# Patient Record
Sex: Male | Born: 1995 | Race: White | Hispanic: No | Marital: Single | Smoking: Never smoker
Health system: Southern US, Community
[De-identification: ages and names within clinical notes are randomized; demographics above are authoritative.]

## PROBLEM LIST (undated history)

## (undated) DIAGNOSIS — S42309A Unspecified fracture of shaft of humerus, unspecified arm, initial encounter for closed fracture: Secondary | ICD-10-CM

## (undated) DIAGNOSIS — F329 Major depressive disorder, single episode, unspecified: Secondary | ICD-10-CM

## (undated) DIAGNOSIS — F32A Depression, unspecified: Secondary | ICD-10-CM

## (undated) DIAGNOSIS — F988 Other specified behavioral and emotional disorders with onset usually occurring in childhood and adolescence: Secondary | ICD-10-CM

## (undated) HISTORY — DX: Unspecified fracture of shaft of humerus, unspecified arm, initial encounter for closed fracture: S42.309A

## (undated) HISTORY — PX: OTHER SURGICAL HISTORY: SHX169

---
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
## 2015-04-27 ENCOUNTER — Other Ambulatory Visit (INDEPENDENT_AMBULATORY_CARE_PROVIDER_SITE_OTHER): Payer: 59

## 2015-04-27 DIAGNOSIS — Z0000 Encounter for general adult medical examination without abnormal findings: Secondary | ICD-10-CM

## 2015-04-27 LAB — HEPATIC FUNCTION PANEL
ALT: 17 U/L (ref 0–53)
AST: 20 U/L (ref 0–37)
Albumin: 4.5 g/dL (ref 3.5–5.2)
Alkaline Phosphatase: 60 U/L (ref 52–171)
Bilirubin, Direct: 0.2 mg/dL (ref 0.0–0.3)
Total Bilirubin: 0.9 mg/dL (ref 0.2–1.2)
Total Protein: 7.5 g/dL (ref 6.0–8.3)

## 2015-04-27 LAB — CBC WITH DIFFERENTIAL/PLATELET
Basophils Absolute: 0 10*3/uL (ref 0.0–0.1)
Basophils Relative: 0.7 % (ref 0.0–3.0)
Eosinophils Absolute: 0.6 10*3/uL (ref 0.0–0.7)
Eosinophils Relative: 8.9 % — ABNORMAL HIGH (ref 0.0–5.0)
HCT: 45.7 % (ref 36.0–49.0)
Hemoglobin: 15.4 g/dL (ref 12.0–16.0)
Lymphocytes Relative: 35.9 % (ref 24.0–48.0)
Lymphs Abs: 2.3 10*3/uL (ref 0.7–4.0)
MCHC: 33.8 g/dL (ref 31.0–37.0)
MCV: 83.1 fl (ref 78.0–98.0)
Monocytes Absolute: 0.5 10*3/uL (ref 0.1–1.0)
Monocytes Relative: 7.8 % (ref 3.0–12.0)
Neutro Abs: 2.9 10*3/uL (ref 1.4–7.7)
Neutrophils Relative %: 46.7 % (ref 43.0–71.0)
Platelets: 257 10*3/uL (ref 150.0–575.0)
RBC: 5.5 Mil/uL (ref 3.80–5.70)
RDW: 13.2 % (ref 11.4–15.5)
WBC: 6.3 10*3/uL (ref 4.5–13.5)

## 2015-04-27 LAB — BASIC METABOLIC PANEL
BUN: 17 mg/dL (ref 6–23)
CO2: 33 mEq/L — ABNORMAL HIGH (ref 19–32)
Calcium: 9.8 mg/dL (ref 8.4–10.5)
Chloride: 102 mEq/L (ref 96–112)
Creatinine, Ser: 1.13 mg/dL (ref 0.40–1.50)
GFR: 88.59 mL/min (ref >60.00)
Glucose, Bld: 122 mg/dL — ABNORMAL HIGH (ref 70–99)
Potassium: 4.3 mEq/L (ref 3.5–5.1)
Sodium: 140 mEq/L (ref 135–145)

## 2015-04-27 LAB — LIPID PANEL
Cholesterol: 118 mg/dL (ref 0–200)
HDL: 33.6 mg/dL — ABNORMAL LOW (ref >39.00)
LDL Cholesterol: 61 mg/dL (ref 0–99)
NonHDL: 84.4
Total CHOL/HDL Ratio: 4
Triglycerides: 116 mg/dL (ref 0.0–149.0)
VLDL: 23.2 mg/dL (ref 0.0–40.0)

## 2015-04-27 LAB — TSH: TSH: 3.66 u[IU]/mL (ref 0.40–5.00)

## 2015-05-03 ENCOUNTER — Encounter: Payer: Self-pay | Admitting: Family Medicine

## 2015-05-03 ENCOUNTER — Ambulatory Visit (INDEPENDENT_AMBULATORY_CARE_PROVIDER_SITE_OTHER): Payer: 59 | Admitting: Family Medicine

## 2015-05-03 VITALS — BP 118/80 | HR 74 | Temp 98.1°F | Ht 68.0 in | Wt 168.0 lb

## 2015-05-03 DIAGNOSIS — Z0000 Encounter for general adult medical examination without abnormal findings: Secondary | ICD-10-CM

## 2015-05-03 MED ORDER — AMPHETAMINE-DEXTROAMPHET ER 20 MG PO CP24: 20.0000 mg | ORAL_CAPSULE | ORAL | Status: DC

## 2015-05-03 MED ORDER — AMPHETAMINE-DEXTROAMPHET ER 20 MG PO CP24: ORAL_CAPSULE | ORAL | Status: DC

## 2015-05-03 NOTE — Progress Notes (Signed)
Pre visit review using our clinic review tool, if applicable. No additional management support is needed unless otherwise documented below in the visit note. 

## 2015-05-03 NOTE — Progress Notes (Signed)
   Subjective:    Patient ID: Bob [name], male    DOB: 1995/11/02, 19 y.o.   MRN: [id number]  HPI Patient here for complete physical. He has history of attention deficit disorder on Adderall. He is preparing to start his sophomore year [employer]. He has strong family history of male pattern baldness. He has some questions regarding finasteride. He is already using topical minoxidil but has had fairly substantial generalized alopecia over the past 6 or 7 months. Generally feels well. Last tetanus reportedly about 7 or 8 years ago. Nonsmoker. No illicit drug use.  Past Medical History  Diagnosis Date  . Broken arm 2002    right  . Depression    Past Surgical History  Procedure Laterality Date  . Broken arm Right     pins at age 63    reports that he has never smoked. He does not have any smokeless tobacco history on file. He reports that he does not drink alcohol or use illicit drugs. family history includes Alcohol abuse in his paternal grandmother; Cancer in his paternal aunt and paternal grandmother; Diabetes in his maternal grandfather; Heart disease in his maternal grandmother; Hyperlipidemia in his maternal grandfather; Hypertension in his maternal grandfather. No Known Allergies    Review of Systems  Constitutional: Negative for fever, activity change, appetite change and fatigue.  HENT: Negative for congestion, ear pain and trouble swallowing.   Eyes: Negative for pain and visual disturbance.  Respiratory: Negative for cough, shortness of breath and wheezing.   Cardiovascular: Negative for chest pain and palpitations.  Gastrointestinal: Negative for nausea, vomiting, abdominal pain, diarrhea, constipation, blood in stool, abdominal distention and rectal pain.  Genitourinary: Negative for dysuria, hematuria and testicular pain.  Musculoskeletal: Negative for joint swelling and arthralgias.  Skin: Negative for rash.  Neurological: Negative for dizziness,  syncope and headaches.  Hematological: Negative for adenopathy.  Psychiatric/Behavioral: Negative for confusion and dysphoric mood.       Objective:   Physical Exam  Constitutional: He is oriented to person, place, and time. He appears well-developed and well-nourished. No distress.  HENT:  Head: Normocephalic and atraumatic.  Right Ear: External ear normal.  Left Ear: External ear normal.  Mouth/Throat: Oropharynx is clear and moist.  Eyes: Conjunctivae and EOM are normal. Pupils are equal, round, and reactive to light.  Neck: Normal range of motion. Neck supple. No thyromegaly present.  Cardiovascular: Normal rate, regular rhythm and normal heart sounds.   No murmur heard. Pulmonary/Chest: No respiratory distress. He has no wheezes. He has no rales.  Abdominal: Soft. Bowel sounds are normal. He exhibits no distension and no mass. There is no tenderness. There is no rebound and no guarding.  Musculoskeletal: He exhibits no edema.  Lymphadenopathy:    He has no cervical adenopathy.  Neurological: He is alert and oriented to person, place, and time. He displays normal reflexes. No cranial nerve deficit.  Skin: No rash noted.  Psychiatric: He has a normal mood and affect.          Assessment & Plan:  Complete physical. Refill Adderall for one year. Labs reviewed. He has low HDL. Blood sugar was 122 but he states was not fasting. We discussed importance of regular consistent exercise. We gave him information regarding finasteride and he would like to research this further and think this over before starting. We reviewed potential adverse side effects with drugs like finasteride including but not limited to infertility

## 2015-05-16 ENCOUNTER — Telehealth: Payer: Self-pay | Admitting: Family Medicine

## 2015-05-16 NOTE — Telephone Encounter (Signed)
Mom call to say that son said he is interested in trying FINASTERIDE     [facility] [address]

## 2015-05-18 MED ORDER — FINASTERIDE 1 MG PO TABS: 1.0000 mg | ORAL_TABLET | Freq: Every day | ORAL | Status: DC

## 2015-05-18 NOTE — Telephone Encounter (Signed)
Pt mother is aware that Rx is sent to pharmacy

## 2015-05-18 NOTE — Telephone Encounter (Signed)
Finasteride 1 mg po qd #30 with 5 refills.

## 2015-07-11 ENCOUNTER — Ambulatory Visit (INDEPENDENT_AMBULATORY_CARE_PROVIDER_SITE_OTHER): Payer: 59 | Admitting: Adult Health

## 2015-07-11 ENCOUNTER — Encounter: Payer: Self-pay | Admitting: Adult Health

## 2015-07-11 VITALS — BP 122/70 | Temp 98.5°F | Ht 68.0 in | Wt 166.0 lb

## 2015-07-11 DIAGNOSIS — J0110 Acute frontal sinusitis, unspecified: Secondary | ICD-10-CM | POA: Diagnosis not present

## 2015-07-11 DIAGNOSIS — J329 Chronic sinusitis, unspecified: Secondary | ICD-10-CM | POA: Insufficient documentation

## 2015-07-11 DIAGNOSIS — J029 Acute pharyngitis, unspecified: Secondary | ICD-10-CM | POA: Diagnosis not present

## 2015-07-11 LAB — POCT RAPID STREP A (OFFICE): Rapid Strep A Screen: NEGATIVE

## 2015-07-11 NOTE — Progress Notes (Signed)
Pre visit review using our clinic review tool, if applicable. No additional management support is needed unless otherwise documented below in the visit note. 

## 2015-07-11 NOTE — Patient Instructions (Addendum)
It was great meeting you today and I am sorry you are feeling bad!  Your strep was negative  It appears as though you have a viral sinus infection. Keep well hydrated and rest, your symptoms should get better in the next 5 days. You can also use OTC Flonase or Saline Spray to help with the sinus pain.   If you decide you want anything for the sore throat or cough, please let me know   Sinusitis Sinusitis is redness, soreness, and inflammation of the paranasal sinuses. Paranasal sinuses are air pockets within the bones of your face (beneath the eyes, the middle of the forehead, or above the eyes). In healthy paranasal sinuses, mucus is able to drain out, and air is able to circulate through them by way of your nose. However, when your paranasal sinuses are inflamed, mucus and air can become trapped. This can allow bacteria and other germs to grow and cause infection. Sinusitis can develop quickly and last only a short time (acute) or continue over a long period (chronic). Sinusitis that lasts for more than 12 weeks is considered chronic.  CAUSES  Causes of sinusitis include:  Allergies.  Structural abnormalities, such as displacement of the cartilage that separates your nostrils (deviated septum), which can decrease the air flow through your nose and sinuses and affect sinus drainage.  Functional abnormalities, such as when the small hairs (cilia) that line your sinuses and help remove mucus do not work properly or are not present. SIGNS AND SYMPTOMS  Symptoms of acute and chronic sinusitis are the same. The primary symptoms are pain and pressure around the affected sinuses. Other symptoms include:  Upper toothache.  Earache.  Headache.  Bad breath.  Decreased sense of smell and taste.  A cough, which worsens when you are lying flat.  Fatigue.  Fever.  Thick drainage from your nose, which often is green and may contain pus (purulent).  Swelling and warmth over the affected  sinuses. DIAGNOSIS  Your health care provider will perform a physical exam. During the exam, your health care provider may:  Look in your nose for signs of abnormal growths in your nostrils (nasal polyps).  Tap over the affected sinus to check for signs of infection.  View the inside of your sinuses (endoscopy) using an imaging device that has a light attached (endoscope). If your health care provider suspects that you have chronic sinusitis, one or more of the following tests may be recommended:  Allergy tests.  Nasal culture. A sample of mucus is taken from your nose, sent to a lab, and screened for bacteria.  Nasal cytology. A sample of mucus is taken from your nose and examined by your health care provider to determine if your sinusitis is related to an allergy. TREATMENT  Most cases of acute sinusitis are related to a viral infection and will resolve on their own within 10 days. Sometimes medicines are prescribed to help relieve symptoms (pain medicine, decongestants, nasal steroid sprays, or saline sprays).  However, for sinusitis related to a bacterial infection, your health care provider will prescribe antibiotic medicines. These are medicines that will help kill the bacteria causing the infection.  Rarely, sinusitis is caused by a fungal infection. In theses cases, your health care provider will prescribe antifungal medicine. For some cases of chronic sinusitis, surgery is needed. Generally, these are cases in which sinusitis recurs more than 3 times per year, despite other treatments. HOME CARE INSTRUCTIONS   Drink plenty of water. Water helps  thin the mucus so your sinuses can drain more easily.  Use a humidifier.  Inhale steam 3 to 4 times a day (for example, sit in the bathroom with the shower running).  Apply a warm, moist washcloth to your face 3 to 4 times a day, or as directed by your health care provider.  Use saline nasal sprays to help moisten and clean your  sinuses.  Take medicines only as directed by your health care provider.  If you were prescribed either an antibiotic or antifungal medicine, finish it all even if you start to feel better. SEEK IMMEDIATE MEDICAL CARE IF:  You have increasing pain or severe headaches.  You have nausea, vomiting, or drowsiness.  You have swelling around your face.  You have vision problems.  You have a stiff neck.  You have difficulty breathing. MAKE SURE YOU:   Understand these instructions.  Will watch your condition.  Will get help right away if you are not doing well or get worse. Document Released: 10/14/2005 Document Revised: 02/28/2014 Document Reviewed: 10/29/2011 [facility] Patient Information 2015 [place], [place]. This information is not intended to replace advice given to you by your health care provider. Make sure you discuss any questions you have with your health care provider.

## 2015-07-11 NOTE — Progress Notes (Signed)
Subjective:    Patient ID: Bob [name], male    DOB: [birth year], 19 y.o.   MRN: [id number]  HPI  19 year old male who presents to the office for congestion, sinus pain and pressure, muscle aches, non productive cough, PND, and slightly sore throat " it dosen't feel like a strep throat sore throat".  He has had these symptoms for less than 24 hours.  His only sick contact is a  66 year old brother has been diagnosed with strep.   No fever, n/v/d or SOB.   Review of Systems  Constitutional: Positive for fatigue. Negative for fever, chills and diaphoresis.  HENT: Positive for congestion, postnasal drip, rhinorrhea, sinus pressure and sore throat. Negative for ear discharge, ear pain, tinnitus, trouble swallowing and voice change.   Eyes: Negative.   Respiratory: Positive for cough.   Cardiovascular: Negative.   Gastrointestinal: Negative.   Musculoskeletal: Negative.   Skin: Negative.   Neurological: Positive for headaches.  Hematological: Negative.   Psychiatric/Behavioral: Negative.   All other systems reviewed and are negative.  Past Medical History  Diagnosis Date  . Broken arm 2002    right  . Depression     Social History   Social History  . Marital Status: Single    Spouse Name: N/A  . Number of Children: N/A  . Years of Education: N/A   Occupational History  . Not on file.   Social History Main Topics  . Smoking status: Never Smoker   . Smokeless tobacco: Not on file  . Alcohol Use: No  . Drug Use: No  . Sexual Activity: No   Other Topics Concern  . Not on file   Social History Narrative    Past Surgical History  Procedure Laterality Date  . Broken arm Right     pins at age 58    Family History  Problem Relation Age of Onset  . Cancer Paternal Aunt     colon  . Heart disease Maternal Grandmother   . Hypertension Maternal Grandfather   . Hyperlipidemia Maternal Grandfather   . Diabetes Maternal Grandfather   . Alcohol abuse  Paternal Grandmother   . Cancer Paternal Grandmother     lung    No Known Allergies  Current Outpatient Prescriptions on File Prior to Visit  Medication Sig Dispense Refill  . amphetamine-dextroamphetamine (ADDERALL XR) 20 MG 24 hr capsule Take 1 capsule (20 mg total) by mouth every morning. 30 capsule 0  . amphetamine-dextroamphetamine (ADDERALL XR) 20 MG 24 hr capsule One daily.  May refill in one month 30 capsule 0  . amphetamine-dextroamphetamine (ADDERALL XR) 20 MG 24 hr capsule One daily.  May refill in two months. 30 capsule 0  . clindamycin-benzoyl peroxide (BENZACLIN) gel Apply 1 application topically as needed. 50 g 5  . finasteride (PROPECIA) 1 MG tablet Take 1 tablet (1 mg total) by mouth daily. (Patient not taking: Reported on 07/11/2015) 30 tablet 5   No current facility-administered medications on file prior to visit.    BP 122/70 mmHg  Temp(Src) 98.5 F (36.9 C) (Oral)  Ht 5\' 8"  (1.727 m)  Wt 166 lb (75.297 kg)  BMI 25.25 kg/m2       Objective:   Physical Exam  Constitutional: He is oriented to person, place, and time. He appears well-developed and well-nourished. No distress.  HENT:  Head: Normocephalic and atraumatic.  Right Ear: External ear normal.  Left Ear: External ear normal.  Nose: Nose normal.  Mouth/Throat:  Oropharynx is clear and moist.  Eyes: Right eye exhibits no discharge. Left eye exhibits no discharge.  Neck: Normal range of motion. Neck supple. No thyromegaly present.  Cardiovascular: Normal rate, regular rhythm, normal heart sounds and intact distal pulses.  Exam reveals no gallop and no friction rub.   No murmur heard. Pulmonary/Chest: Effort normal and breath sounds normal. No respiratory distress. He has no wheezes. He has no rales. He exhibits no tenderness.  Musculoskeletal: Normal range of motion. He exhibits no edema.  Lymphadenopathy:    He has no cervical adenopathy.  Neurological: He is alert and oriented to person, place, and  time.  Skin: Skin is warm and dry. No rash noted. He is not diaphoretic. No erythema. No pallor.  Psychiatric: He has a normal mood and affect. His behavior is normal. Judgment and thought content normal.  Nursing note and vitals reviewed.      Assessment & Plan:  1. Sore throat - Likely viral. No signs of strep at this time - POCT rapid strep A- Negative  2. Acute frontal sinusitis, recurrence not specified - Less than 24 hours of symptoms. Will treat as a viral and offer supportative care at this time.  - Will use Mucinex, Flonase and Ibuprofen as needed - Follow up if no improvement in 3-4 days or sooner if symptoms worsen or fever greater than 101.

## 2015-08-30 ENCOUNTER — Ambulatory Visit (INDEPENDENT_AMBULATORY_CARE_PROVIDER_SITE_OTHER): Payer: 59

## 2015-08-30 DIAGNOSIS — Z23 Encounter for immunization: Secondary | ICD-10-CM | POA: Diagnosis not present

## 2015-11-03 ENCOUNTER — Ambulatory Visit (INDEPENDENT_AMBULATORY_CARE_PROVIDER_SITE_OTHER): Payer: BLUE CROSS/BLUE SHIELD | Admitting: Family Medicine

## 2015-11-03 DIAGNOSIS — Z23 Encounter for immunization: Secondary | ICD-10-CM

## 2015-11-30 ENCOUNTER — Telehealth: Payer: Self-pay | Admitting: Family Medicine

## 2015-11-30 NOTE — Telephone Encounter (Signed)
Pt needs new rx finasteride 1 mg #90 send to new pharm [facility]

## 2015-12-01 MED ORDER — FINASTERIDE 1 MG PO TABS: 1.0000 mg | ORAL_TABLET | Freq: Every day | ORAL | Status: DC

## 2015-12-01 NOTE — Telephone Encounter (Signed)
Medication sent in for patient. 

## 2016-02-27 ENCOUNTER — Ambulatory Visit (INDEPENDENT_AMBULATORY_CARE_PROVIDER_SITE_OTHER): Payer: BLUE CROSS/BLUE SHIELD | Admitting: Family Medicine

## 2016-02-27 DIAGNOSIS — Z23 Encounter for immunization: Secondary | ICD-10-CM

## 2016-06-07 ENCOUNTER — Telehealth: Payer: Self-pay | Admitting: Family Medicine

## 2016-06-07 NOTE — Telephone Encounter (Signed)
° °  Refill     amphetamine-dextroamphetamine (ADDERALL XR) 20  MG 24 hr     finasteride (PROPECIA) 1 MG tablet  [facility]

## 2016-06-10 NOTE — Telephone Encounter (Signed)
Last OV was 07/11/2015 Last refill 05/03/2015 x3 Please advise on refills No pending scheduled

## 2016-06-10 NOTE — Telephone Encounter (Signed)
Can you please schedule this pt a follow up this week. Can discuss refill at OV. Thanks.

## 2016-06-10 NOTE — Telephone Encounter (Signed)
° °  Lm for pt to call and schedule an appt per Dr Caryl NeverBurchette

## 2016-06-10 NOTE — Telephone Encounter (Signed)
Needs office follow up

## 2016-06-14 ENCOUNTER — Encounter: Payer: Self-pay | Admitting: Family Medicine

## 2016-06-14 ENCOUNTER — Ambulatory Visit (INDEPENDENT_AMBULATORY_CARE_PROVIDER_SITE_OTHER): Payer: BLUE CROSS/BLUE SHIELD | Admitting: Family Medicine

## 2016-06-14 VITALS — BP 120/82 | HR 58 | Temp 97.8°F | Ht 68.0 in | Wt 165.3 lb

## 2016-06-14 DIAGNOSIS — L720 Epidermal cyst: Secondary | ICD-10-CM | POA: Diagnosis not present

## 2016-06-14 DIAGNOSIS — F909 Attention-deficit hyperactivity disorder, unspecified type: Secondary | ICD-10-CM

## 2016-06-14 DIAGNOSIS — F988 Other specified behavioral and emotional disorders with onset usually occurring in childhood and adolescence: Secondary | ICD-10-CM

## 2016-06-14 MED ORDER — AMPHETAMINE-DEXTROAMPHET ER 20 MG PO CP24: 20.0000 mg | ORAL_CAPSULE | ORAL | 0 refills | Status: DC

## 2016-06-14 MED ORDER — AMPHETAMINE-DEXTROAMPHET ER 20 MG PO CP24: ORAL_CAPSULE | ORAL | 0 refills | Status: DC

## 2016-06-14 NOTE — Progress Notes (Signed)
Subjective:     Patient ID: Bob [name], male   DOB: 06/05/1996, 20 y.o.   MRN: [id number]  HPI Here for follow-up ADD. He takes Adderall XR 20 mg daily. Preparing to start classes at [employer]. 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 Generally feels well. No recent headaches. No chest pains. No dizziness. Good appetite.  He has small cystic subcutaneous lesion mid chest. Not growing in size. Nontender  Past Medical History:  Diagnosis Date  . Broken arm 2002   right  . Depression    Past Surgical History:  Procedure Laterality Date  . broken arm Right    pins at age 34    reports that he has never smoked. He does not have any smokeless tobacco history on file. He reports that he does not drink alcohol or use drugs. family history includes Alcohol abuse in his paternal grandmother; Cancer in his paternal aunt and paternal grandmother; Diabetes in his maternal grandfather; Heart disease in his maternal grandmother; Hyperlipidemia in his maternal grandfather; Hypertension in his maternal grandfather. No Known Allergies   Review of Systems  Constitutional: Negative for fatigue.  Eyes: Negative for visual disturbance.  Respiratory: Negative for cough, chest tightness and shortness of breath.   Cardiovascular: Negative for chest pain, palpitations and leg swelling.  Neurological: Negative for dizziness, syncope, weakness, light-headedness and headaches.       Objective:   Physical Exam  Constitutional: He appears well-developed and well-nourished.  Cardiovascular: Normal rate and regular rhythm.   Pulmonary/Chest: Effort normal and breath sounds normal. No respiratory distress. He has no wheezes. He has no rales.  Skin:  Patient has small approximately 1/2 cm slightly mobile slightly darkened color subcutaneous inclusion cyst. Nonfluctuant       Assessment:     #1 attention deficit disorder. Stable  #2 benign epidermal cyst mid chest    Plan:      -Refill Adderall XR for 3 months -Reassurance regarding cystic lesion anterior chest-follow-up for any signs of secondary infection  Kristian CoveyBruce W Wilena Tyndall MD [facility]

## 2016-06-14 NOTE — Progress Notes (Signed)
Pre visit review using our clinic review tool, if applicable. No additional management support is needed unless otherwise documented below in the visit note. 

## 2016-06-14 NOTE — Patient Instructions (Signed)
Epidermal Cyst An epidermal cyst is sometimes called a sebaceous cyst, epidermal inclusion cyst, or infundibular cyst. These cysts usually contain a substance that looks "pasty" or "cheesy" and may have a bad smell. This substance is a protein called keratin. Epidermal cysts are usually found on the face, neck, or trunk. They may also occur in the vaginal area or other parts of the genitalia of both men and women. Epidermal cysts are usually small, painless, slow-growing bumps or lumps that move freely under the skin. It is important not to try to pop them. This may cause an infection and lead to tenderness and swelling. CAUSES  Epidermal cysts may be caused by a deep penetrating injury to the skin or a plugged hair follicle, often associated with acne. SYMPTOMS  Epidermal cysts can become inflamed and cause:  Redness.  Tenderness.  Increased temperature of the skin over the bumps or lumps.  Grayish-white, bad smelling material that drains from the bump or lump. DIAGNOSIS  Epidermal cysts are easily diagnosed by your caregiver during an exam. Rarely, a tissue sample (biopsy) may be taken to rule out other conditions that may resemble epidermal cysts. TREATMENT   Epidermal cysts often get better and disappear on their own. They are rarely ever cancerous.  If a cyst becomes infected, it may become inflamed and tender. This may require opening and draining the cyst. Treatment with antibiotics may be necessary. When the infection is gone, the cyst may be removed with minor surgery.  Small, inflamed cysts can often be treated with antibiotics or by injecting steroid medicines.  Sometimes, epidermal cysts become large and bothersome. If this happens, surgical removal in your caregiver's office may be necessary. HOME CARE INSTRUCTIONS  Only take over-the-counter or prescription medicines as directed by your caregiver.  Take your antibiotics as directed. Finish them even if you start to feel  better. SEEK MEDICAL CARE IF:   Your cyst becomes tender, red, or swollen.  Your condition is not improving or is getting worse.  You have any other questions or concerns. MAKE SURE YOU:  Understand these instructions.  Will watch your condition.  Will get help right away if you are not doing well or get worse.   This information is not intended to replace advice given to you by your health care provider. Make sure you discuss any questions you have with your health care provider.   Document Released: 09/14/2004 Document Revised: 01/06/2012 Document Reviewed: 04/22/2011 Elsevier Interactive Patient Education 2016 Elsevier Inc.  

## 2016-06-15 ENCOUNTER — Other Ambulatory Visit: Payer: Self-pay | Admitting: Family Medicine

## 2016-08-15 ENCOUNTER — Encounter: Payer: Self-pay | Admitting: Family Medicine

## 2017-01-22 ENCOUNTER — Ambulatory Visit (INDEPENDENT_AMBULATORY_CARE_PROVIDER_SITE_OTHER): Payer: BLUE CROSS/BLUE SHIELD | Admitting: Family Medicine

## 2017-01-22 ENCOUNTER — Encounter: Payer: Self-pay | Admitting: Family Medicine

## 2017-01-22 VITALS — BP 118/82 | HR 84 | Temp 98.3°F | Wt 162.4 lb

## 2017-01-22 DIAGNOSIS — F988 Other specified behavioral and emotional disorders with onset usually occurring in childhood and adolescence: Secondary | ICD-10-CM | POA: Diagnosis not present

## 2017-01-22 MED ORDER — AMPHETAMINE-DEXTROAMPHETAMINE 10 MG PO TABS: 10.0000 mg | ORAL_TABLET | Freq: Every day | ORAL | 0 refills | Status: DC

## 2017-01-22 MED ORDER — AMPHETAMINE-DEXTROAMPHET ER 20 MG PO CP24: ORAL_CAPSULE | ORAL | 0 refills | Status: DC

## 2017-01-22 MED ORDER — AMPHETAMINE-DEXTROAMPHET ER 20 MG PO CP24: 20.0000 mg | ORAL_CAPSULE | ORAL | 0 refills | Status: DC

## 2017-01-22 NOTE — Progress Notes (Signed)
Pre visit review using our clinic review tool, if applicable. No additional management support is needed unless otherwise documented below in the visit note. 

## 2017-01-22 NOTE — Progress Notes (Signed)
Subjective:     Patient ID: Bob [name], male   DOB: [birth year], 21 y.o.   MRN: [id number]  HPI Seen for follow-up ADD. He is attending undergrad at [employer] and plans study abroad in [place] this fall. He takes Adderall XR 20 mg and this works well. However, he has 2 days a week where he only has one class and would like to have short acting smaller dose of Adderall for those days if he doesn't have to take the XR. Denies any side effects. Appetite and weight stable. No insomnia issues.  Past Medical History:  Diagnosis Date  . Broken arm 2002   right  . Depression    Past Surgical History:  Procedure Laterality Date  . broken arm Right    pins at age 1    reports that he has never smoked. He has never used smokeless tobacco. He reports that he does not drink alcohol or use drugs. family history includes Alcohol abuse in his paternal grandmother; Cancer in his paternal aunt and paternal grandmother; Diabetes in his maternal grandfather; Heart disease in his maternal grandmother; Hyperlipidemia in his maternal grandfather; Hypertension in his maternal grandfather. No Known Allergies   Review of Systems  Constitutional: Negative for fatigue.  Eyes: Negative for visual disturbance.  Respiratory: Negative for cough, chest tightness and shortness of breath.   Cardiovascular: Negative for chest pain, palpitations and leg swelling.  Neurological: Negative for dizziness, syncope, weakness, light-headedness and headaches.       Objective:   Physical Exam  Constitutional: He is oriented to person, place, and time. He appears well-developed and well-nourished.  HENT:  Right Ear: External ear normal.  Left Ear: External ear normal.  Mouth/Throat: Oropharynx is clear and moist.  Eyes: Pupils are equal, round, and reactive to light.  Neck: Neck supple. No thyromegaly present.  Cardiovascular: Normal rate and regular rhythm.   Pulmonary/Chest: Effort normal and breath  sounds normal. No respiratory distress. He has no wheezes. He has no rales.  Musculoskeletal: He exhibits no edema.  Neurological: He is alert and oriented to person, place, and time.       Assessment:     ADD-stable    Plan:     -Refill Adderall XR 20 mg once daily for 3 months -Wrote for limited Adderall 10 mg take 1 daily for days that he does not take the XR.  Bob CoveyBruce W Jamicia Haaland MD [facility]

## 2017-01-26 ENCOUNTER — Encounter (HOSPITAL_COMMUNITY): Payer: Self-pay | Admitting: Emergency Medicine

## 2017-01-26 ENCOUNTER — Ambulatory Visit (HOSPITAL_COMMUNITY)
Admission: EM | Admit: 2017-01-26 | Discharge: 2017-01-26 | Disposition: A | Discharge status: Home / Self Care | Payer: BLUE CROSS/BLUE SHIELD | Attending: Family Medicine | Admitting: Family Medicine

## 2017-01-26 ENCOUNTER — Ambulatory Visit (INDEPENDENT_AMBULATORY_CARE_PROVIDER_SITE_OTHER): Payer: BLUE CROSS/BLUE SHIELD

## 2017-01-26 DIAGNOSIS — S92255A Nondisplaced fracture of navicular [scaphoid] of left foot, initial encounter for closed fracture: Secondary | ICD-10-CM | POA: Diagnosis not present

## 2017-01-26 MED ORDER — IBUPROFEN 800 MG PO TABS: 800.0000 mg | ORAL_TABLET | Freq: Three times a day (TID) | ORAL | 0 refills | Status: DC

## 2017-01-26 NOTE — ED Provider Notes (Signed)
CSN: [id number]     Arrival date & time 01/26/17  1353 History   First MD Initiated Contact with Patient 01/26/17 1501     Chief Complaint  Patient presents with  . Foot Pain   (Consider location/radiation/quality/duration/timing/severity/associated sxs/prior Treatment) Patient c/o left foot pain pain after falling while playing soccer.   The history is provided by the patient.  Foot Pain  This is a new problem. The current episode started 1 to 2 hours ago. The problem occurs constantly. The problem has not changed since onset.Nothing aggravates the symptoms. Nothing relieves the symptoms.    Past Medical History:  Diagnosis Date  . Broken arm 2002   right  . Depression    Past Surgical History:  Procedure Laterality Date  . broken arm Right    pins at age 45   Family History  Problem Relation Age of Onset  . Cancer Paternal Aunt     colon  . Heart disease Maternal Grandmother   . Hypertension Maternal Grandfather   . Hyperlipidemia Maternal Grandfather   . Diabetes Maternal Grandfather   . Alcohol abuse Paternal Grandmother   . Cancer Paternal Grandmother     lung   Social History  Substance Use Topics  . Smoking status: Never Smoker  . Smokeless tobacco: Never Used  . Alcohol use No    Review of Systems  Constitutional: Negative.   HENT: Negative.   Eyes: Negative.   Respiratory: Negative.   Cardiovascular: Negative.   Gastrointestinal: Negative.   Endocrine: Negative.   Genitourinary: Negative.   Musculoskeletal: Positive for arthralgias.  Allergic/Immunologic: Negative.   Neurological: Negative.   Hematological: Negative.   Psychiatric/Behavioral: Negative.     Allergies  Codeine and Sulfa antibiotics  Home Medications   Prior to Admission medications   Medication Sig Start Date End Date Taking? Authorizing Provider  amphetamine-dextroamphetamine (ADDERALL XR) 20 MG 24 hr capsule Take 1 capsule (20 mg total) by mouth every morning. 01/22/17  Yes  Kristian Covey, MD  ibuprofen (ADVIL,MOTRIN) 800 MG tablet Take 1 tablet (800 mg total) by mouth 3 (three) times daily. 01/26/17   Deatra Canter, FNP   Meds Ordered and Administered this Visit  Medications - No data to display  BP 120/75 (BP Location: Left Arm)   Pulse 70   Temp 98.5 F (36.9 C) (Oral)   Resp 16   SpO2 100%  No data found.   Physical Exam  Constitutional: He appears well-developed and well-nourished.  HENT:  Head: Normocephalic and atraumatic.  Eyes: Conjunctivae and EOM are normal. Pupils are equal, round, and reactive to light.  Neck: Normal range of motion. Neck supple.  Cardiovascular: Normal rate, regular rhythm and normal heart sounds.   Pulmonary/Chest: Effort normal and breath sounds normal.  Musculoskeletal: He exhibits tenderness.  Left lateral malleolus with swelling and tenderness.  Nursing note and vitals reviewed.   Urgent Care Course     Procedures (including critical care time)  Labs Review Labs Reviewed - No data to display  Imaging Review Dg Foot Complete Left  Result Date: 01/26/2017 CLINICAL DATA:  Soccer injury today with left foot pain and swelling EXAM: LEFT FOOT - COMPLETE 3+ VIEW COMPARISON:  None. FINDINGS: There is prominent soft tissue swelling in the dorsal and plantar proximal left foot. Nondisplaced avulsion fracture noted in the dorsal navicular. No additional fracture. No dislocation. No suspicious focal osseous lesion. No appreciable arthropathy. No radiopaque foreign body. IMPRESSION: Prominent dorsal and plantar proximal left foot soft  tissue swelling. Nondisplaced dorsal navicular avulsion fracture. Electronically Signed   By: Delbert Phenix M.D.   On: 01/26/2017 14:54     Visual Acuity Review  Right Eye Distance:   Left Eye Distance:   Bilateral Distance:    Right Eye Near:   Left Eye Near:    Bilateral Near:         MDM   1. Closed nondisplaced fracture of navicular bone of left foot, initial encounter     Cam Walker Motrin Referral to Orthopedics      Deatra Canter, FNP 01/26/17 [phone]

## 2017-01-26 NOTE — ED Triage Notes (Signed)
The patient presented to the [facility] with a complaint of left foot pain secondary to a fall when kicking a soccer ball earlier today.

## 2017-04-23 ENCOUNTER — Other Ambulatory Visit: Payer: Self-pay | Admitting: Family Medicine

## 2017-07-17 ENCOUNTER — Encounter: Payer: Self-pay | Admitting: Family Medicine

## 2017-12-31 ENCOUNTER — Encounter: Payer: Self-pay | Admitting: Family Medicine

## 2017-12-31 ENCOUNTER — Ambulatory Visit (INDEPENDENT_AMBULATORY_CARE_PROVIDER_SITE_OTHER): Payer: 59 | Admitting: Family Medicine

## 2017-12-31 VITALS — BP 110/80 | HR 77 | Temp 97.5°F | Wt 177.6 lb

## 2017-12-31 DIAGNOSIS — R1011 Right upper quadrant pain: Secondary | ICD-10-CM | POA: Diagnosis not present

## 2017-12-31 DIAGNOSIS — F988 Other specified behavioral and emotional disorders with onset usually occurring in childhood and adolescence: Secondary | ICD-10-CM

## 2017-12-31 MED ORDER — AMPHETAMINE-DEXTROAMPHET ER 20 MG PO CP24: 20.0000 mg | ORAL_CAPSULE | Freq: Every day | ORAL | 0 refills | Status: DC

## 2017-12-31 MED ORDER — AMPHETAMINE-DEXTROAMPHET ER 20 MG PO CP24: 20.0000 mg | ORAL_CAPSULE | ORAL | 0 refills | Status: DC

## 2017-12-31 MED ORDER — AMPHETAMINE-DEXTROAMPHETAMINE 20 MG PO TABS: 20.0000 mg | ORAL_TABLET | Freq: Every day | ORAL | 0 refills | Status: DC

## 2017-12-31 NOTE — Progress Notes (Signed)
Subjective:     Patient ID: Bob [name], male   DOB: 1995/11/02, 22 y.o.   MRN: [id number]  HPI Patient seen for complaints of one month history of some right upper and mid quadrant abdominal pain. Symptoms are very intermittent. He just returned after prolonged [place] to [place]. He states he drank more beer there then usual but is not sure if there is any correlation. He never had any epigastric pain. No nausea or vomiting. No appetite or weight changes. No fever. No postprandial symptoms. Currently takes about one glass of wine every couple days. Normal bowel movements. No history of pancreatitis. Last episode of abdominal pain couple days ago. Symptoms sometimes last one or 2 hours.  History of ADD. Requesting refills of Adderall. He is on Adderall XR 20 mg once daily. Has been working well for him. He inquires about getting immediate release to take certain days a week when he has extended classes and work loads. No insomnia.  Past Medical History:  Diagnosis Date  . Broken arm 2002   right  . Depression    Past Surgical History:  Procedure Laterality Date  . broken arm Right    pins at age 6    reports that  has never smoked. he has never used smokeless tobacco. He reports that he does not drink alcohol or use drugs. family history includes Alcohol abuse in his paternal grandmother; Cancer in his paternal aunt and paternal grandmother; Diabetes in his maternal grandfather; Heart disease in his maternal grandmother; Hyperlipidemia in his maternal grandfather; Hypertension in his maternal grandfather. Allergies  Allergen Reactions  . Codeine   . Sulfa Antibiotics      Review of Systems  Constitutional: Negative for fatigue.  Eyes: Negative for visual disturbance.  Respiratory: Negative for cough, chest tightness and shortness of breath.   Cardiovascular: Negative for chest pain, palpitations and leg swelling.  Gastrointestinal: Positive for abdominal pain. Negative for  abdominal distention, blood in stool, constipation, diarrhea, nausea and vomiting.  Genitourinary: Negative for dysuria and hematuria.  Neurological: Negative for dizziness, syncope, weakness, light-headedness and headaches.       Objective:   Physical Exam  Constitutional: He is oriented to person, place, and time. He appears well-developed and well-nourished.  HENT:  Right Ear: External ear normal.  Left Ear: External ear normal.  Mouth/Throat: Oropharynx is clear and moist.  Eyes: Pupils are equal, round, and reactive to light.  Neck: Neck supple. No thyromegaly present.  Cardiovascular: Normal rate and regular rhythm.  Pulmonary/Chest: Effort normal and breath sounds normal. No respiratory distress. He has no wheezes. He has no rales.  Abdominal: Soft. Bowel sounds are normal. He exhibits no distension and no mass. There is no tenderness. There is no rebound and no guarding.  Musculoskeletal: He exhibits no edema.  Neurological: He is alert and oriented to person, place, and time.       Assessment:     #1 right upper quadrant abdominal pain which is very sporadic and not associated with any clear triggers. He has benign exam at this time. No red flags such as vomiting, fever, melena, weight change, etc.  Doubt gallstones, pancreatitis, or PUD.    #2 attention deficit disorder    Plan:     -Check labs with CBC, comprehensive metabolic panel, lipase -Scale back alcohol use -Consider trial of antacid such as Zantac or Pepcid -Consider upper abdominal ultrasound if symptoms persist- though clinically doubt gallstones and no tenderness on exam -refilled Adderall for  3 months.  Kristian Covey MD [facility] at [facility]

## 2017-12-31 NOTE — Patient Instructions (Signed)

## 2018-02-20 ENCOUNTER — Telehealth: Payer: Self-pay | Admitting: *Deleted

## 2018-02-20 NOTE — Telephone Encounter (Signed)
Prior auth for Amphetamine-dextro 20mg  sent to [email or web address].

## 2018-02-23 NOTE — Telephone Encounter (Signed)
Fax received from [facility] stating the request was approved until 02/21/2019.  I called [facility] and informed  Nene of this.

## 2018-07-21 ENCOUNTER — Ambulatory Visit: Payer: BLUE CROSS/BLUE SHIELD | Admitting: Family Medicine

## 2018-07-21 ENCOUNTER — Telehealth: Payer: Self-pay | Admitting: Family Medicine

## 2018-07-21 DIAGNOSIS — Z0289 Encounter for other administrative examinations: Secondary | ICD-10-CM

## 2018-07-21 NOTE — Telephone Encounter (Signed)
Copied from CRM [id number]. Topic: Quick Communication - See Telephone Encounter >> Jul 21, 2018  4:31 PM Terisa Starraylor, Brittany L wrote: CRM for notification. See Telephone encounter for: 07/21/18.  amphetamine-dextroamphetamine (ADDERALL XR) 20 MG 24 hr capsule, patient was suppose to come in at 4:15 for this appointment for medication refill. He canceled due to work. Has not R/S yet.  [id number] - [place], [place] - [id number] [place] [phone]

## 2018-07-21 NOTE — Telephone Encounter (Signed)
Reschedule follow up.  Thanks.

## 2018-07-21 NOTE — Telephone Encounter (Signed)
Patient did not show for appointment or canceled appointment for today. Patient does have an appointment for tomorrow.   Please advise if okay to call patient and advise that no refills until patient is seen at his appointment.

## 2018-07-22 ENCOUNTER — Ambulatory Visit: Payer: BLUE CROSS/BLUE SHIELD | Admitting: Family Medicine

## 2018-07-22 ENCOUNTER — Other Ambulatory Visit: Payer: Self-pay

## 2018-07-22 ENCOUNTER — Encounter: Payer: Self-pay | Admitting: Family Medicine

## 2018-07-22 VITALS — BP 108/74 | HR 90 | Temp 99.2°F | Wt 182.1 lb

## 2018-07-22 DIAGNOSIS — F988 Other specified behavioral and emotional disorders with onset usually occurring in childhood and adolescence: Secondary | ICD-10-CM | POA: Diagnosis not present

## 2018-07-22 DIAGNOSIS — Z23 Encounter for immunization: Secondary | ICD-10-CM | POA: Diagnosis not present

## 2018-07-22 MED ORDER — AMPHETAMINE-DEXTROAMPHET ER 20 MG PO CP24: 20.0000 mg | ORAL_CAPSULE | Freq: Every day | ORAL | 0 refills | Status: DC

## 2018-07-22 MED ORDER — AMPHETAMINE-DEXTROAMPHETAMINE 20 MG PO TABS: 20.0000 mg | ORAL_TABLET | Freq: Every day | ORAL | 0 refills | Status: DC

## 2018-07-22 MED ORDER — AMPHETAMINE-DEXTROAMPHET ER 20 MG PO CP24: 20.0000 mg | ORAL_CAPSULE | ORAL | 0 refills | Status: DC

## 2018-07-22 NOTE — Telephone Encounter (Signed)
Patient is scheduled for appointment today 

## 2018-07-22 NOTE — Progress Notes (Signed)
  Subjective:     Patient ID: Bob [name], male   DOB: Nov 26, 1995, 22 y.o.   MRN: [id number]  HPI Patient here for follow-up regarding attention deficit disorder.  Takes Adderall XR 20 mg daily.  He occasionally supplements with 20 mg 1/2 tablet of immediate release late afternoons.  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  That is going fairly well.  He feels like the Adderall dosage that he is on currently is the right dosage.  No insomnia.  No headaches.  No significant appetite suppression.  Past Medical History:  Diagnosis Date  . Broken arm 2002   right  . Depression    Past Surgical History:  Procedure Laterality Date  . broken arm Right    pins at age 24    reports that he has never smoked. He has never used smokeless tobacco. He reports that he does not drink alcohol or use drugs. family history includes Alcohol abuse in his paternal grandmother; Cancer in his paternal aunt and paternal grandmother; Diabetes in his maternal grandfather; Heart disease in his maternal grandmother; Hyperlipidemia in his maternal grandfather; Hypertension in his maternal grandfather. Allergies  Allergen Reactions  . Codeine   . Sulfa Antibiotics      Review of Systems  Constitutional: Negative for fatigue.  Eyes: Negative for visual disturbance.  Respiratory: Negative for cough, chest tightness and shortness of breath.   Cardiovascular: Negative for chest pain, palpitations and leg swelling.  Neurological: Negative for dizziness, syncope, weakness, light-headedness and headaches.       Objective:   Physical Exam  Constitutional: He is oriented to person, place, and time. He appears well-developed and well-nourished.  HENT:  Right Ear: External ear normal.  Left Ear: External ear normal.  Mouth/Throat: Oropharynx is clear and moist.  Eyes: Pupils are equal, round, and reactive to light.  Neck: Neck supple. No thyromegaly present.   Cardiovascular: Normal rate and regular rhythm.  Pulmonary/Chest: Effort normal and breath sounds normal. No respiratory distress. He has no wheezes. He has no rales.  Musculoskeletal: He exhibits no edema.  Neurological: He is alert and oriented to person, place, and time.       Assessment:     Attention deficit disorder.  Stable on Adderall    Plan:     -Refilled medications for 3 months -He will call when refills due in 3 to 4 months -Flu vaccine given  Kristian CoveyBruce W Sarinah Doetsch MD [facility]

## 2018-12-25 ENCOUNTER — Encounter: Payer: Self-pay | Admitting: Family Medicine

## 2018-12-25 ENCOUNTER — Other Ambulatory Visit: Payer: Self-pay

## 2018-12-25 ENCOUNTER — Ambulatory Visit: Payer: BLUE CROSS/BLUE SHIELD | Admitting: Family Medicine

## 2018-12-25 VITALS — BP 124/74 | HR 80 | Temp 98.2°F | Ht 68.0 in | Wt 171.7 lb

## 2018-12-25 DIAGNOSIS — F988 Other specified behavioral and emotional disorders with onset usually occurring in childhood and adolescence: Secondary | ICD-10-CM | POA: Diagnosis not present

## 2018-12-25 MED ORDER — AMPHETAMINE-DEXTROAMPHET ER 20 MG PO CP24: 20.0000 mg | ORAL_CAPSULE | ORAL | 0 refills | Status: DC

## 2018-12-25 MED ORDER — AMPHETAMINE-DEXTROAMPHET ER 20 MG PO CP24: 20.0000 mg | ORAL_CAPSULE | Freq: Every day | ORAL | 0 refills | Status: DC

## 2018-12-25 MED ORDER — AMPHETAMINE-DEXTROAMPHETAMINE 20 MG PO TABS: ORAL_TABLET | ORAL | 0 refills | Status: DC

## 2018-12-25 NOTE — Progress Notes (Signed)
  Subjective:     Patient ID: Bob [name], male   DOB: August 07, 1996, 23 y.o.   MRN: [id number]  HPI Patient is seen for follow-up regarding attention deficit disorder.  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  He remains in grad school currently only taking 1 class.  He takes Adderall XR 20 mg daily and occasionally supplements with 1/2 to 1 tablet of immediate release 20 mg late in the day.  Denies any insomnia.  He has lost some weight from last year he attributes this to healthier diet and increased exercise.  He has good appetite.  Feels well overall.  Past Medical History:  Diagnosis Date  . Broken arm 2002   right  . Depression    Past Surgical History:  Procedure Laterality Date  . broken arm Right    pins at age 89    reports that he has never smoked. He has never used smokeless tobacco. He reports that he does not drink alcohol or use drugs. family history includes Alcohol abuse in his paternal grandmother; Cancer in his paternal aunt and paternal grandmother; Diabetes in his maternal grandfather; Heart disease in his maternal grandmother; Hyperlipidemia in his maternal grandfather; Hypertension in his maternal grandfather. Allergies  Allergen Reactions  . Codeine   . Sulfa Antibiotics      Review of Systems  Constitutional: Negative for appetite change and unexpected weight change.  Respiratory: Negative for shortness of breath.   Cardiovascular: Negative for chest pain.  Psychiatric/Behavioral: Negative for sleep disturbance.       Objective:   Physical Exam Constitutional:      Appearance: Normal appearance.  Cardiovascular:     Rate and Rhythm: Normal rate and regular rhythm.  Pulmonary:     Effort: Pulmonary effort is normal.     Breath sounds: Normal breath sounds.  Musculoskeletal:     Right lower leg: No edema.     Left lower leg: No edema.  Neurological:     Mental Status: He is alert.        Assessment:      Attention deficit disorder stable    Plan:     -Refilled Adderall for 3 months -Continue regular exercise habits -We suggested that he consider complete physical at some point this year  Kristian Covey MD  Primary Care at [facility]

## 2019-04-26 ENCOUNTER — Other Ambulatory Visit: Payer: Self-pay | Admitting: Family Medicine

## 2019-04-27 MED ORDER — AMPHETAMINE-DEXTROAMPHET ER 20 MG PO CP24: 20.0000 mg | ORAL_CAPSULE | Freq: Every day | ORAL | 0 refills | Status: DC

## 2019-04-27 MED ORDER — AMPHETAMINE-DEXTROAMPHET ER 20 MG PO CP24: 20.0000 mg | ORAL_CAPSULE | ORAL | 0 refills | Status: DC

## 2019-04-27 NOTE — Telephone Encounter (Signed)
Last OV 12/25/18, No future OV  Last filled 12/25/18, # 30 with 3 refills

## 2019-06-08 ENCOUNTER — Encounter: Payer: Self-pay | Admitting: Family Medicine

## 2019-06-09 MED ORDER — AMPHETAMINE-DEXTROAMPHETAMINE 20 MG PO TABS: ORAL_TABLET | ORAL | 0 refills | Status: DC

## 2019-09-21 ENCOUNTER — Other Ambulatory Visit: Payer: Self-pay | Admitting: Family Medicine

## 2019-09-21 MED ORDER — AMPHETAMINE-DEXTROAMPHETAMINE 20 MG PO TABS: ORAL_TABLET | ORAL | 0 refills | Status: DC

## 2019-09-21 MED ORDER — AMPHETAMINE-DEXTROAMPHET ER 20 MG PO CP24: 20.0000 mg | ORAL_CAPSULE | ORAL | 0 refills | Status: DC

## 2019-09-21 MED ORDER — AMPHETAMINE-DEXTROAMPHET ER 20 MG PO CP24: 20.0000 mg | ORAL_CAPSULE | Freq: Every day | ORAL | 0 refills | Status: DC

## 2019-09-21 NOTE — Telephone Encounter (Signed)
Refilled Adderall.  Needs follow up by 2/21

## 2019-09-21 NOTE — Telephone Encounter (Signed)
Patient has not had a physical or ROV since last OV on 2/20

## 2020-01-06 ENCOUNTER — Other Ambulatory Visit: Payer: Self-pay | Admitting: Family Medicine

## 2020-01-06 NOTE — Telephone Encounter (Signed)
Needs follow up.  Not seen in > one year.  Doxy would be OK if he prefers.

## 2020-01-06 NOTE — Telephone Encounter (Signed)
Patient has an appointment 01/07/20.

## 2020-01-07 ENCOUNTER — Other Ambulatory Visit: Payer: Self-pay

## 2020-01-07 ENCOUNTER — Telehealth (INDEPENDENT_AMBULATORY_CARE_PROVIDER_SITE_OTHER): Payer: 59 | Admitting: Family Medicine

## 2020-01-07 ENCOUNTER — Encounter: Payer: Self-pay | Admitting: Family Medicine

## 2020-01-07 DIAGNOSIS — F988 Other specified behavioral and emotional disorders with onset usually occurring in childhood and adolescence: Secondary | ICD-10-CM

## 2020-01-07 MED ORDER — AMPHETAMINE-DEXTROAMPHET ER 20 MG PO CP24: 20.0000 mg | ORAL_CAPSULE | Freq: Every day | ORAL | 0 refills | Status: DC

## 2020-01-07 MED ORDER — AMPHETAMINE-DEXTROAMPHETAMINE 20 MG PO TABS: ORAL_TABLET | ORAL | 0 refills | Status: DC

## 2020-01-07 MED ORDER — AMPHETAMINE-DEXTROAMPHET ER 20 MG PO CP24: 20.0000 mg | ORAL_CAPSULE | ORAL | 0 refills | Status: DC

## 2020-01-07 NOTE — Progress Notes (Signed)
This visit type was conducted due to national recommendations for restrictions regarding the COVID-19 pandemic in an effort to limit this patient's exposure and mitigate transmission in our community.   Virtual Visit via Video Note  I connected with Bob [name] on 01/07/20 at  1:45 PM EST by a video enabled telemedicine application and verified that I am speaking with the correct person using two identifiers.  Location patient: home Location provider:work or home office Persons participating in the virtual visit: patient, provider  I discussed the limitations of evaluation and management by telemedicine and the availability of in person appointments. The patient expressed understanding and agreed to proceed.   HPI: Bob [name] has history of attention deficit disorder.  He is on Adderall XR 20 mg daily.  Occasionally supplements with 20 mg for very long days.  He is working from home.  He feels medication is working well.  No adverse side effects.  He has been very health-conscious during the past year with eating well and exercising regularly.  No recent chest pains.  No history of hypertension.  No major appetite suppression.  No headaches.   ROS: See pertinent positives and negatives per HPI.  Past Medical History:  Diagnosis Date  . Broken arm 2002   right  . Depression     Past Surgical History:  Procedure Laterality Date  . broken arm Right    pins at age 73    Family History  Problem Relation Age of Onset  . Cancer Paternal Aunt        colon  . Heart disease Maternal Grandmother   . Hypertension Maternal Grandfather   . Hyperlipidemia Maternal Grandfather   . Diabetes Maternal Grandfather   . Alcohol abuse Paternal Grandmother   . Cancer Paternal Grandmother        lung    SOCIAL HX: Non-smoker   Current Outpatient Medications:  .  amphetamine-dextroamphetamine (ADDERALL XR) 20 MG 24 hr capsule, Take 1 capsule (20 mg total) by mouth every morning., Disp:  30 capsule, Rfl: 0 .  amphetamine-dextroamphetamine (ADDERALL XR) 20 MG 24 hr capsule, Take 1 capsule (20 mg total) by mouth daily., Disp: 30 capsule, Rfl: 0 .  amphetamine-dextroamphetamine (ADDERALL XR) 20 MG 24 hr capsule, Take 1 capsule (20 mg total) by mouth daily., Disp: 30 capsule, Rfl: 0 .  amphetamine-dextroamphetamine (ADDERALL) 20 MG tablet, Take one tablet daily as needed, Disp: 30 tablet, Rfl: 0 .  b complex vitamins capsule, Take 1 capsule by mouth daily., Disp: , Rfl:  .  cetirizine (ZYRTEC) 10 MG tablet, Take 10 mg by mouth as needed. , Disp: , Rfl:   EXAM:  VITALS per patient if applicable:  GENERAL: alert, oriented, appears well and in no acute distress  HEENT: atraumatic, conjunttiva clear, no obvious abnormalities on inspection of external nose and ears  NECK: normal movements of the head and neck  LUNGS: on inspection no signs of respiratory distress, breathing rate appears normal, no obvious gross SOB, gasping or wheezing  CV: no obvious cyanosis  MS: moves all visible extremities without noticeable abnormality  PSYCH/NEURO: pleasant and cooperative, no obvious depression or anxiety, speech and thought processing grossly intact  ASSESSMENT AND PLAN:  Discussed the following assessment and plan:  Attention deficit disorder-  stable -Refill Adderall for 3 months -Recommend routine follow-up in 1 year and sooner as needed    I discussed the assessment and treatment plan with the patient. The patient was provided an opportunity to ask questions and all  were answered. The patient agreed with the plan and demonstrated an understanding of the instructions.   The patient was advised to call back or seek an in-person evaluation if the symptoms worsen or if the condition fails to improve as anticipated.     Carolann Littler, MD

## 2020-03-08 ENCOUNTER — Other Ambulatory Visit: Payer: Self-pay | Admitting: Family Medicine

## 2020-03-08 ENCOUNTER — Encounter: Payer: Self-pay | Admitting: Family Medicine

## 2020-03-08 MED ORDER — AMPHETAMINE-DEXTROAMPHETAMINE 20 MG PO TABS: ORAL_TABLET | ORAL | 0 refills | Status: DC

## 2020-03-08 NOTE — Telephone Encounter (Signed)
Refilled

## 2020-03-08 NOTE — Telephone Encounter (Signed)
New rx sent

## 2020-03-08 NOTE — Telephone Encounter (Signed)
Please advise pt lost bottle

## 2020-03-29 ENCOUNTER — Encounter (HOSPITAL_COMMUNITY): Payer: Self-pay

## 2020-03-29 ENCOUNTER — Ambulatory Visit (HOSPITAL_COMMUNITY)
Admission: EM | Admit: 2020-03-29 | Discharge: 2020-03-29 | Disposition: A | Discharge status: Home / Self Care | Payer: No Typology Code available for payment source | Attending: Family Medicine | Admitting: Family Medicine

## 2020-03-29 DIAGNOSIS — R1310 Dysphagia, unspecified: Secondary | ICD-10-CM

## 2020-03-29 HISTORY — DX: Other specified behavioral and emotional disorders with onset usually occurring in childhood and adolescence: F98.8

## 2020-03-29 NOTE — ED Provider Notes (Signed)
[facility]   [id number] 03/29/20 Arrival Time: 1528  ASSESSMENT & PLAN:  1. Dysphagia, unspecified type     Normal throat and lung exam. Reassured. Discussed s/s to watch for if one is concerned about aspiration. He is comfortable with observation.  Given dysphagia that is slightly more noticeable over the past few months, he plans to call [facility] to schedule an appointment.   Reviewed expectations re: course of current medical issues. Questions answered. Outlined signs and symptoms indicating need for more acute intervention. Patient verbalized understanding. After Visit Summary given.   SUBJECTIVE: History from: patient. Bob [name] is a 24 y.o. male who presents with complaint of choking on a piece of chicken today. Reports that it was caught in his throat and that he spent approx 15 min trying to cough it up. Was able to breathe the whole time. Here "just to get checked out and make sure I didn't aspirate". No respiratory or swallowing difficulties currently. Reports occasional dysphagia over the past few months. No frank GERD symptoms.  Past Surgical History:  Procedure Laterality Date  . broken arm Right    pins at age 73     OBJECTIVE:  Vitals:   03/29/20 1618 03/29/20 1620  BP: 138/81   Pulse: 68   Resp: 16   Temp: 98.4 F (36.9 C)   TempSrc: Oral   SpO2: 100%   Weight:  81.6 kg  Height:  5\' 9"  (1.753 m)    General appearance: alert, oriented, no acute distress HEENT: [place]; AT; oropharynx moist ; throat appears normal Lungs: unlabored respirations; CTAB Abdomen: soft Skin: warm and dry Psychological: alert and cooperative; normal mood and affect    Allergies  Allergen Reactions  . Codeine   . Sulfa Antibiotics                                                Past Medical History:  Diagnosis Date  . ADD (attention deficit disorder)   . Broken arm 2002   right  . Depression     Social History   Socioeconomic History  .  Marital status: Significant Other    Spouse name: Not on file  . Number of children: Not on file  . Years of education: Not on file  . Highest education level: Not on file  Occupational History  . Not on file  Tobacco Use  . Smoking status: Never Smoker  . Smokeless tobacco: Never Used  Substance and Sexual Activity  . Alcohol use: Yes    Alcohol/week: 6.0 standard drinks    Types: 6 Cans of beer per week  . Drug use: No  . Sexual activity: Yes    Birth control/protection: Pill  Other Topics Concern  . Not on file  Social History Narrative  . Not on file   Social Determinants of Health   Financial Resource Strain:   . Difficulty of Paying Living Expenses:   Food Insecurity:   . Worried About in the Last Year:   . Programme researcher, broadcasting/film/video in the Last Year:   Transportation Needs:   . Barista (Medical):   Freight forwarder Lack of Transportation (Non-Medical):   Physical Activity:   . Days of Exercise per Week:   . Minutes of Exercise per Session:   Stress:   . Feeling  of Stress :   Social Connections:   . Frequency of Communication with Friends and Family:   . Frequency of Social Gatherings with Friends and Family:   . Attends Religious Services:   . Active Member of Clubs or Organizations:   . Attends Archivist Meetings:   Marland Kitchen Marital Status:   Intimate Partner Violence:   . Fear of Current or Ex-Partner:   . Emotionally Abused:   Marland Kitchen Physically Abused:   . Sexually Abused:     Family History  Problem Relation Age of Onset  . Cancer Paternal Aunt        colon  . Heart disease Maternal Grandmother   . Hypertension Maternal Grandfather   . Hyperlipidemia Maternal Grandfather   . Diabetes Maternal Grandfather   . Alcohol abuse Paternal Grandmother   . Cancer Paternal Grandmother        lung     Vanessa Kick, MD 03/29/20 [phone]

## 2020-03-29 NOTE — ED Triage Notes (Signed)
Pt states he choked on chicken around 1515 today and he just wants to be seen to make sure he didn't aspirate. Pt has non labored breathing. Skin color WNL.

## 2020-05-09 ENCOUNTER — Other Ambulatory Visit: Payer: Self-pay | Admitting: Family Medicine

## 2020-05-09 MED ORDER — AMPHETAMINE-DEXTROAMPHET ER 20 MG PO CP24: 20.0000 mg | ORAL_CAPSULE | ORAL | 0 refills | Status: DC

## 2020-05-09 MED ORDER — AMPHETAMINE-DEXTROAMPHET ER 20 MG PO CP24: 20.0000 mg | ORAL_CAPSULE | Freq: Every day | ORAL | 0 refills | Status: DC

## 2020-05-09 MED ORDER — AMPHETAMINE-DEXTROAMPHETAMINE 20 MG PO TABS: ORAL_TABLET | ORAL | 0 refills | Status: DC

## 2020-05-09 NOTE — Telephone Encounter (Signed)
Please advise 

## 2020-07-25 ENCOUNTER — Other Ambulatory Visit: Payer: Self-pay | Admitting: Family Medicine

## 2020-07-25 MED ORDER — AMPHETAMINE-DEXTROAMPHET ER 20 MG PO CP24: 20.0000 mg | ORAL_CAPSULE | Freq: Every day | ORAL | 0 refills | Status: DC

## 2020-07-25 MED ORDER — AMPHETAMINE-DEXTROAMPHET ER 20 MG PO CP24: 20.0000 mg | ORAL_CAPSULE | ORAL | 0 refills | Status: DC

## 2020-07-25 MED ORDER — AMPHETAMINE-DEXTROAMPHETAMINE 20 MG PO TABS
ORAL_TABLET | ORAL | 0 refills | Status: DC
Start: 2020-07-25 — End: 2020-10-09

## 2020-07-25 NOTE — Telephone Encounter (Signed)
Last OV 05/09/2020  Last filled 01/07/2020, # 30 with 3 refills

## 2020-07-26 ENCOUNTER — Ambulatory Visit: Payer: No Typology Code available for payment source | Admitting: Podiatry

## 2020-07-26 ENCOUNTER — Other Ambulatory Visit: Payer: Self-pay

## 2020-07-26 DIAGNOSIS — B070 Plantar wart: Secondary | ICD-10-CM

## 2020-07-26 NOTE — Progress Notes (Signed)
   Subjective: 24 y.o. male presenting today as a new patient for evaluation of a callus that is been present for about 5 years to the right great toe.  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  He states that the suppose it callus on the right great toe is now causing changes in the way that he ambulates.  He has not done anything for treatment.  He presents for further treatment evaluation   Past Medical History:  Diagnosis Date  . ADD (attention deficit disorder)   . Broken arm 2002   right  . Depression     Objective: Physical Exam General: The patient is alert and oriented x3 in no acute distress.   Dermatology: Hyperkeratotic skin lesion(s) noted to the plantar aspect of the right foot approximately 1 cm in diameter. Pinpoint bleeding noted upon debridement. Skin is warm, dry and supple bilateral lower extremities. Negative for open lesions or macerations.   Vascular: Palpable pedal pulses bilaterally. No edema or erythema noted. Capillary refill within normal limits.   Neurological: Epicritic and protective threshold grossly intact bilaterally.    Musculoskeletal Exam: Pain on palpation to the noted skin lesion(s).  Range of motion within normal limits to all pedal and ankle joints bilateral. Muscle strength 5/5 in all groups bilateral.    Assessment: #1 plantar wart right great toe #2 pain in right foot     Plan of Care:  #1 Patient was evaluated. #2 Excisional debridement of the plantar wart lesion(s) was performed using a chisel blade.  Salicylic acid was applied and the lesion(s) was dressed with a dry sterile dressing. #3  Recommend OTC wart remover daily under occlusion with a Band-Aid #4 today I did explain to the patient that this is in fact a plantar verruca/wart.  Explained the etiology of warts.  It was at this time the patient states that he gets recurrent warts to the hands. #5 return to clinic in 4 weeks     Felecia Shelling, DPM [facility]  Dr. Felecia Shelling, DPM    [address]                                        [place], [place] [address]                Office [phone]  Fax [phone]

## 2020-08-21 ENCOUNTER — Encounter: Payer: Self-pay | Admitting: Family Medicine

## 2020-08-23 ENCOUNTER — Other Ambulatory Visit: Payer: Self-pay

## 2020-08-23 ENCOUNTER — Ambulatory Visit: Payer: No Typology Code available for payment source | Admitting: Podiatry

## 2020-08-23 DIAGNOSIS — B070 Plantar wart: Secondary | ICD-10-CM | POA: Diagnosis not present

## 2020-08-23 NOTE — Progress Notes (Signed)
   Subjective: 24 y.o. male presenting today for follow-up evaluation of plantar verruca to the right great toe this been going on for approximately 5 years now.  He does have a history of recurrent warts that have developed to the hands as well.   Past Medical History:  Diagnosis Date  . ADD (attention deficit disorder)   . Broken arm 2002   right  . Depression     Objective: Physical Exam General: The patient is alert and oriented x3 in no acute distress.   Dermatology: Hyperkeratotic skin lesion(s) noted to the plantar aspect of the right foot approximately 1 cm in diameter. Pinpoint bleeding noted upon debridement. Skin is warm, dry and supple bilateral lower extremities. Negative for open lesions or macerations.   Vascular: Palpable pedal pulses bilaterally. No edema or erythema noted. Capillary refill within normal limits.   Neurological: Epicritic and protective threshold grossly intact bilaterally.    Musculoskeletal Exam: Pain on palpation to the noted skin lesion(s).  Range of motion within normal limits to all pedal and ankle joints bilateral. Muscle strength 5/5 in all groups bilateral.    Assessment: #1 plantar wart right great toe; superficial spreading type #2 pain in right foot     Plan of Care:  #1 Patient was evaluated. #2 Excisional debridement of the plantar wart lesion(s) was performed using a chisel blade.  Salicylic acid was applied and the lesion(s) was dressed with a dry sterile dressing. #3  Recommend OTC wart remover daily under occlusion with a Band-Aid.  The patient has had significant improvement over the last 4 weeks #4  Return to clinic in 4 weeks.  If the patient has not significantly improved we may need to consider laser treatment therapy.     Felecia Shelling, DPM [facility]  Dr. Felecia Shelling, DPM    [address]                                        [place], [place] [address]                Office [phone]  Fax  [phone]

## 2020-09-25 ENCOUNTER — Ambulatory Visit: Payer: No Typology Code available for payment source | Admitting: Podiatry

## 2020-09-25 ENCOUNTER — Other Ambulatory Visit: Payer: Self-pay

## 2020-09-25 DIAGNOSIS — B070 Plantar wart: Secondary | ICD-10-CM

## 2020-09-25 NOTE — Progress Notes (Signed)
   Subjective: 24 y.o. male presenting today for follow-up evaluation of plantar verruca to the right great toe this been going on for approximately 5 years now.  He does have a history of recurrent warts that have developed to the hands as well.   Past Medical History:  Diagnosis Date  . ADD (attention deficit disorder)   . Broken arm 2002   right  . Depression     Objective: Physical Exam General: The patient is alert and oriented x3 in no acute distress.   Dermatology: Hyperkeratotic skin lesion(s) noted to the plantar aspect of the right foot approximately 1 cm in diameter. Pinpoint bleeding noted upon debridement. Skin is warm, dry and supple bilateral lower extremities. Negative for open lesions or macerations.   Vascular: Palpable pedal pulses bilaterally. No edema or erythema noted. Capillary refill within normal limits.   Neurological: Epicritic and protective threshold grossly intact bilaterally.    Musculoskeletal Exam: Pain on palpation to the noted skin lesion(s).  Range of motion within normal limits to all pedal and ankle joints bilateral. Muscle strength 5/5 in all groups bilateral.    Assessment: #1 plantar wart right great toe; superficial spreading type #2 pain in right foot     Plan of Care:  #1 Patient was evaluated. #2 Excisional debridement of the plantar wart lesion(s) was performed using a chisel blade.   #3  At this point we are going to pursue laser treatment therapy of the plantar verruca.  Appointment was made with nurse for laser treatment follow-up     Felecia Shelling, DPM [facility]  Dr. Felecia Shelling, DPM    [address] [place], [place] [address]                Office [phone]  Fax [phone]

## 2020-10-09 ENCOUNTER — Ambulatory Visit (INDEPENDENT_AMBULATORY_CARE_PROVIDER_SITE_OTHER): Payer: No Typology Code available for payment source | Admitting: *Deleted

## 2020-10-09 ENCOUNTER — Other Ambulatory Visit: Payer: Self-pay

## 2020-10-09 ENCOUNTER — Other Ambulatory Visit: Payer: Self-pay | Admitting: Family Medicine

## 2020-10-09 DIAGNOSIS — B351 Tinea unguium: Secondary | ICD-10-CM

## 2020-10-09 DIAGNOSIS — B070 Plantar wart: Secondary | ICD-10-CM

## 2020-10-09 MED ORDER — AMPHETAMINE-DEXTROAMPHET ER 20 MG PO CP24
20.0000 mg | ORAL_CAPSULE | Freq: Every day | ORAL | 0 refills | Status: DC
Start: 2020-10-09 — End: 2020-11-28

## 2020-10-09 MED ORDER — AMPHETAMINE-DEXTROAMPHET ER 20 MG PO CP24
20.0000 mg | ORAL_CAPSULE | ORAL | 0 refills | Status: DC
Start: 2020-10-09 — End: 2021-08-01

## 2020-10-09 MED ORDER — AMPHETAMINE-DEXTROAMPHETAMINE 20 MG PO TABS
ORAL_TABLET | ORAL | 0 refills | Status: DC
Start: 2020-10-09 — End: 2020-11-28

## 2020-10-09 NOTE — Progress Notes (Signed)
Patient presents today for laser treatment for plantar warts on the right foot. There are 2 lesions, one large area plantar hallux and one smaller area plantar lateral.  Dr. Logan Bores patient.  All other systems are negative.  Lesions were debrided superficially. Laser therapy was administered to the right foot. The patient tolerated the treatment well. All safety precautions were in place.   Follow up in 2 weeks for laser # 2.   ~Pic of wart taken today~

## 2020-10-25 ENCOUNTER — Other Ambulatory Visit: Payer: Self-pay

## 2020-10-25 ENCOUNTER — Ambulatory Visit (INDEPENDENT_AMBULATORY_CARE_PROVIDER_SITE_OTHER): Payer: No Typology Code available for payment source

## 2020-10-25 DIAGNOSIS — B070 Plantar wart: Secondary | ICD-10-CM

## 2020-10-25 NOTE — Progress Notes (Signed)
Patient presents today for 2nd laser treatment for plantar warts on the right foot. There are 2 lesions, one large area plantar hallux and one smaller area plantar lateral.  Dr. Logan Bores patient.  All other systems are negative.  Lesions were debrided superficially. Laser therapy was administered to the right foot. The patient tolerated the treatment well. All safety precautions were in place.   Follow up in 2 weeks for laser # 3.

## 2020-11-20 ENCOUNTER — Other Ambulatory Visit: Payer: Self-pay

## 2020-11-20 ENCOUNTER — Ambulatory Visit: Payer: No Typology Code available for payment source | Admitting: *Deleted

## 2020-11-20 DIAGNOSIS — B070 Plantar wart: Secondary | ICD-10-CM

## 2020-11-20 NOTE — Progress Notes (Signed)
Patient presents today for laser treatment for plantar warts on the right foot.   There are 2 lesions, one large area plantar hallux and one smaller area plantar lateral.  Dr. Logan Bores patient.  All other systems are negative.  Lesions were debrided superficially. Laser therapy was administered to the right foot. The patient tolerated the treatment well. All safety precautions were in place.   Follow up in 2 weeks for laser # 4

## 2020-11-28 ENCOUNTER — Ambulatory Visit (HOSPITAL_COMMUNITY)
Admission: EM | Admit: 2020-11-28 | Discharge: 2020-11-29 | Disposition: A | Discharge status: Home / Self Care | Payer: No Typology Code available for payment source | Attending: Emergency Medicine | Admitting: Emergency Medicine

## 2020-11-28 ENCOUNTER — Other Ambulatory Visit: Payer: Self-pay

## 2020-11-28 ENCOUNTER — Encounter (HOSPITAL_COMMUNITY): Payer: Self-pay | Admitting: *Deleted

## 2020-11-28 DIAGNOSIS — Z885 Allergy status to narcotic agent status: Secondary | ICD-10-CM | POA: Diagnosis not present

## 2020-11-28 DIAGNOSIS — K353 Acute appendicitis with localized peritonitis, without perforation or gangrene: Secondary | ICD-10-CM | POA: Diagnosis not present

## 2020-11-28 DIAGNOSIS — Z20822 Contact with and (suspected) exposure to covid-19: Secondary | ICD-10-CM | POA: Diagnosis not present

## 2020-11-28 DIAGNOSIS — Z882 Allergy status to sulfonamides status: Secondary | ICD-10-CM | POA: Diagnosis not present

## 2020-11-28 DIAGNOSIS — Z79899 Other long term (current) drug therapy: Secondary | ICD-10-CM | POA: Diagnosis not present

## 2020-11-28 DIAGNOSIS — Z801 Family history of malignant neoplasm of trachea, bronchus and lung: Secondary | ICD-10-CM | POA: Diagnosis not present

## 2020-11-28 LAB — COMPREHENSIVE METABOLIC PANEL
ALT: 35 U/L (ref 0–44)
AST: 32 U/L (ref 15–41)
Albumin: 4.3 g/dL (ref 3.5–5.0)
Alkaline Phosphatase: 51 U/L (ref 38–126)
Anion gap: 13 (ref 5–15)
BUN: 13 mg/dL (ref 6–20)
CO2: 27 mmol/L (ref 22–32)
Calcium: 9.7 mg/dL (ref 8.9–10.3)
Chloride: 100 mmol/L (ref 98–111)
Creatinine, Ser: 1.05 mg/dL (ref 0.61–1.24)
GFR, Estimated: >60 mL/min (ref >60)
Glucose, Bld: 119 mg/dL — ABNORMAL HIGH (ref 70–99)
Potassium: 3.6 mmol/L (ref 3.5–5.1)
Sodium: 140 mmol/L (ref 135–145)
Total Bilirubin: 1 mg/dL (ref 0.3–1.2)
Total Protein: 7 g/dL (ref 6.5–8.1)

## 2020-11-28 LAB — CBC
HCT: 42.9 % (ref 39.0–52.0)
Hemoglobin: 14.9 g/dL (ref 13.0–17.0)
MCH: 28.9 pg (ref 26.0–34.0)
MCHC: 34.7 g/dL (ref 30.0–36.0)
MCV: 83.3 fL (ref 80.0–100.0)
Platelets: 302 10*3/uL (ref 150–400)
RBC: 5.15 MIL/uL (ref 4.22–5.81)
RDW: 12.5 % (ref 11.5–15.5)
WBC: 15.4 10*3/uL — ABNORMAL HIGH (ref 4.0–10.5)
nRBC: 0 % (ref 0.0–0.2)

## 2020-11-28 LAB — LIPASE, BLOOD: Lipase: 25 U/L (ref 11–51)

## 2020-11-28 LAB — URINALYSIS, ROUTINE W REFLEX MICROSCOPIC
Bilirubin Urine: NEGATIVE
Glucose, UA: 100 mg/dL — AB
Hgb urine dipstick: NEGATIVE
Ketones, ur: NEGATIVE mg/dL
Leukocytes,Ua: NEGATIVE
Nitrite: NEGATIVE
Protein, ur: NEGATIVE mg/dL
Specific Gravity, Urine: 1.01 (ref 1.005–1.030)
pH: 7 (ref 5.0–8.0)

## 2020-11-28 NOTE — ED Triage Notes (Signed)
Right lower abdominal pain that started about 1 hour ago. Nausea. Last BM around noon. Denies fevers.

## 2020-11-29 ENCOUNTER — Encounter (HOSPITAL_COMMUNITY)
Admission: EM | Disposition: A | Discharge status: Home / Self Care | Payer: Self-pay | Source: Home / Self Care | Attending: Emergency Medicine

## 2020-11-29 ENCOUNTER — Emergency Department (HOSPITAL_COMMUNITY): Payer: No Typology Code available for payment source | Admitting: Anesthesiology

## 2020-11-29 ENCOUNTER — Emergency Department (HOSPITAL_COMMUNITY): Payer: No Typology Code available for payment source

## 2020-11-29 ENCOUNTER — Encounter (HOSPITAL_COMMUNITY): Payer: Self-pay | Admitting: Orthopedic Surgery

## 2020-11-29 HISTORY — PX: LAPAROSCOPIC APPENDECTOMY: SHX408

## 2020-11-29 LAB — SARS CORONAVIRUS 2 BY RT PCR (HOSPITAL ORDER, PERFORMED IN ~~LOC~~ HOSPITAL LAB): SARS Coronavirus 2: NEGATIVE

## 2020-11-29 SURGERY — APPENDECTOMY, LAPAROSCOPIC
Anesthesia: General

## 2020-11-29 MED ORDER — BUPIVACAINE-EPINEPHRINE 0.25% -1:200000 IJ SOLN
INTRAMUSCULAR | Status: DC | PRN
  Administered 2020-11-29: 7 mL

## 2020-11-29 MED ORDER — OXYCODONE HCL 5 MG PO TABS: 5.0000 mg | ORAL_TABLET | Freq: Four times a day (QID) | ORAL | 0 refills | Status: DC | PRN

## 2020-11-29 MED ORDER — IOHEXOL 300 MG/ML  SOLN
100.0000 mL | Freq: Once | INTRAMUSCULAR | Status: AC
  Administered 2020-11-29: 100 mL via INTRAVENOUS

## 2020-11-29 MED ORDER — PROMETHAZINE HCL 25 MG/ML IJ SOLN: 6.2500 mg | INTRAMUSCULAR | Status: DC | PRN

## 2020-11-29 MED ORDER — ACETAMINOPHEN 325 MG PO TABS: 650.0000 mg | ORAL_TABLET | ORAL | Status: DC | PRN

## 2020-11-29 MED ORDER — SODIUM CHLORIDE 0.9% FLUSH
3.0000 mL | INTRAVENOUS | Status: DC | PRN
Start: 2020-11-29 — End: 2020-11-29

## 2020-11-29 MED ORDER — SUCCINYLCHOLINE CHLORIDE 200 MG/10ML IV SOSY
PREFILLED_SYRINGE | INTRAVENOUS | Status: DC | PRN
  Administered 2020-11-29: 120 mg via INTRAVENOUS

## 2020-11-29 MED ORDER — ONDANSETRON HCL 4 MG/2ML IJ SOLN
INTRAMUSCULAR | Status: AC
  Filled 2020-11-29: qty 2

## 2020-11-29 MED ORDER — CHLORHEXIDINE GLUCONATE 0.12 % MT SOLN: 15.0000 mL | Freq: Once | OROMUCOSAL | Status: AC

## 2020-11-29 MED ORDER — DEXAMETHASONE SODIUM PHOSPHATE 10 MG/ML IJ SOLN
INTRAMUSCULAR | Status: DC | PRN
  Administered 2020-11-29: 4 mg via INTRAVENOUS

## 2020-11-29 MED ORDER — ACETAMINOPHEN 500 MG PO TABS
1000.0000 mg | ORAL_TABLET | ORAL | Status: AC
  Administered 2020-11-29: 1000 mg via ORAL
  Filled 2020-11-29: qty 2

## 2020-11-29 MED ORDER — CHLORHEXIDINE GLUCONATE 0.12 % MT SOLN
OROMUCOSAL | Status: AC
  Administered 2020-11-29: 15 mL via OROMUCOSAL
  Filled 2020-11-29: qty 15

## 2020-11-29 MED ORDER — SODIUM CHLORIDE 0.9 % IV SOLN: INTRAVENOUS | Status: DC

## 2020-11-29 MED ORDER — DEXAMETHASONE SODIUM PHOSPHATE 10 MG/ML IJ SOLN
INTRAMUSCULAR | Status: AC
  Filled 2020-11-29: qty 1

## 2020-11-29 MED ORDER — FENTANYL CITRATE (PF) 250 MCG/5ML IJ SOLN
INTRAMUSCULAR | Status: AC
  Filled 2020-11-29: qty 5

## 2020-11-29 MED ORDER — KETOROLAC TROMETHAMINE 30 MG/ML IJ SOLN
INTRAMUSCULAR | Status: AC
  Filled 2020-11-29: qty 1

## 2020-11-29 MED ORDER — MIDAZOLAM HCL 2 MG/2ML IJ SOLN
INTRAMUSCULAR | Status: AC
  Filled 2020-11-29: qty 2

## 2020-11-29 MED ORDER — ROCURONIUM BROMIDE 10 MG/ML (PF) SYRINGE
PREFILLED_SYRINGE | INTRAVENOUS | Status: DC | PRN
  Administered 2020-11-29: 60 mg via INTRAVENOUS

## 2020-11-29 MED ORDER — LIDOCAINE 2% (20 MG/ML) 5 ML SYRINGE
INTRAMUSCULAR | Status: DC | PRN
  Administered 2020-11-29: 100 mg via INTRAVENOUS

## 2020-11-29 MED ORDER — LACTATED RINGERS IV SOLN: INTRAVENOUS | Status: DC

## 2020-11-29 MED ORDER — PROPOFOL 10 MG/ML IV BOLUS
INTRAVENOUS | Status: DC | PRN
  Administered 2020-11-29: 200 mg via INTRAVENOUS

## 2020-11-29 MED ORDER — SODIUM CHLORIDE 0.9 % IV SOLN
2.0000 g | Freq: Once | INTRAVENOUS | Status: AC
  Administered 2020-11-29: 2 g via INTRAVENOUS
  Filled 2020-11-29: qty 20

## 2020-11-29 MED ORDER — SUGAMMADEX SODIUM 200 MG/2ML IV SOLN
INTRAVENOUS | Status: DC | PRN
  Administered 2020-11-29: 150 mg via INTRAVENOUS

## 2020-11-29 MED ORDER — METRONIDAZOLE IN NACL 5-0.79 MG/ML-% IV SOLN
500.0000 mg | Freq: Once | INTRAVENOUS | Status: AC
  Administered 2020-11-29: 500 mg via INTRAVENOUS
  Filled 2020-11-29: qty 100

## 2020-11-29 MED ORDER — MIDAZOLAM HCL 5 MG/5ML IJ SOLN
INTRAMUSCULAR | Status: DC | PRN
  Administered 2020-11-29: 2 mg via INTRAVENOUS

## 2020-11-29 MED ORDER — FENTANYL CITRATE (PF) 250 MCG/5ML IJ SOLN
INTRAMUSCULAR | Status: DC | PRN
  Administered 2020-11-29: 100 ug via INTRAVENOUS
  Administered 2020-11-29: 50 ug via INTRAVENOUS

## 2020-11-29 MED ORDER — GABAPENTIN 300 MG PO CAPS
300.0000 mg | ORAL_CAPSULE | ORAL | Status: AC
  Administered 2020-11-29: 300 mg via ORAL
  Filled 2020-11-29: qty 1

## 2020-11-29 MED ORDER — FENTANYL CITRATE (PF) 100 MCG/2ML IJ SOLN: 25.0000 ug | INTRAMUSCULAR | Status: DC | PRN

## 2020-11-29 MED ORDER — ACETAMINOPHEN 650 MG RE SUPP: 650.0000 mg | RECTAL | Status: DC | PRN

## 2020-11-29 MED ORDER — SODIUM CHLORIDE 0.9 % IV SOLN: 250.0000 mL | INTRAVENOUS | Status: DC | PRN

## 2020-11-29 MED ORDER — LIDOCAINE 2% (20 MG/ML) 5 ML SYRINGE
INTRAMUSCULAR | Status: AC
  Filled 2020-11-29: qty 5

## 2020-11-29 MED ORDER — KETOROLAC TROMETHAMINE 15 MG/ML IJ SOLN: 15.0000 mg | Freq: Four times a day (QID) | INTRAMUSCULAR | Status: DC | PRN

## 2020-11-29 MED ORDER — SODIUM CHLORIDE 0.9% FLUSH: 3.0000 mL | Freq: Two times a day (BID) | INTRAVENOUS | Status: DC

## 2020-11-29 MED ORDER — ROCURONIUM BROMIDE 10 MG/ML (PF) SYRINGE
PREFILLED_SYRINGE | INTRAVENOUS | Status: AC
  Filled 2020-11-29: qty 10

## 2020-11-29 MED ORDER — SCOPOLAMINE 1 MG/3DAYS TD PT72
1.0000 | MEDICATED_PATCH | TRANSDERMAL | Status: DC
  Administered 2020-11-29: 1.5 mg via TRANSDERMAL
  Filled 2020-11-29: qty 1

## 2020-11-29 MED ORDER — KETOROLAC TROMETHAMINE 30 MG/ML IJ SOLN
INTRAMUSCULAR | Status: DC | PRN
  Administered 2020-11-29: 30 mg via INTRAVENOUS

## 2020-11-29 MED ORDER — ONDANSETRON HCL 4 MG/2ML IJ SOLN
INTRAMUSCULAR | Status: DC | PRN
  Administered 2020-11-29: 4 mg via INTRAVENOUS

## 2020-11-29 MED ORDER — BUPIVACAINE-EPINEPHRINE (PF) 0.25% -1:200000 IJ SOLN
INTRAMUSCULAR | Status: AC
  Filled 2020-11-29: qty 20

## 2020-11-29 MED ORDER — PROPOFOL 10 MG/ML IV BOLUS
INTRAVENOUS | Status: AC
  Filled 2020-11-29: qty 40

## 2020-11-29 MED ORDER — OXYCODONE HCL 5 MG PO TABS: 5.0000 mg | ORAL_TABLET | ORAL | Status: DC | PRN

## 2020-11-29 MED ORDER — SUCCINYLCHOLINE CHLORIDE 200 MG/10ML IV SOSY
PREFILLED_SYRINGE | INTRAVENOUS | Status: AC
  Filled 2020-11-29: qty 10

## 2020-11-29 SURGICAL SUPPLY — 47 items
ADH SKN CLS APL DERMABOND .7 (GAUZE/BANDAGES/DRESSINGS) ×1
APL PRP STRL LF DISP 70% ISPRP (MISCELLANEOUS) ×1
APPLIER CLIP ROT 10 11.4 M/L (STAPLE)
APR CLP MED LRG 11.4X10 (STAPLE)
BAG SPEC RTRVL 10 TROC 200 (ENDOMECHANICALS) ×1
CANISTER SUCT 3000ML PPV (MISCELLANEOUS) ×1 IMPLANT
CHLORAPREP W/TINT 26 (MISCELLANEOUS) ×2 IMPLANT
CLIP APPLIE ROT 10 11.4 M/L (STAPLE) IMPLANT
COVER SURGICAL LIGHT HANDLE (MISCELLANEOUS) ×2 IMPLANT
COVER WAND RF STERILE (DRAPES) ×1 IMPLANT
CUTTER FLEX LINEAR 45M (STAPLE) ×2 IMPLANT
DERMABOND ADVANCED (GAUZE/BANDAGES/DRESSINGS) ×1
DERMABOND ADVANCED .7 DNX12 (GAUZE/BANDAGES/DRESSINGS) ×1 IMPLANT
ELECT REM PT RETURN 9FT ADLT (ELECTROSURGICAL) ×2
ELECTRODE REM PT RTRN 9FT ADLT (ELECTROSURGICAL) ×1 IMPLANT
GLOVE BIO SURGEON STRL SZ7 (GLOVE) ×2 IMPLANT
GLOVE BIOGEL PI IND STRL 7.5 (GLOVE) ×1 IMPLANT
GLOVE BIOGEL PI INDICATOR 7.5 (GLOVE) ×1
GOWN STRL REUS W/ TWL LRG LVL3 (GOWN DISPOSABLE) ×3 IMPLANT
GOWN STRL REUS W/TWL LRG LVL3 (GOWN DISPOSABLE) ×6
GRASPER SUT TROCAR 14GX15 (MISCELLANEOUS) ×2 IMPLANT
KIT BASIN OR (CUSTOM PROCEDURE TRAY) ×2 IMPLANT
KIT TURNOVER KIT B (KITS) ×2 IMPLANT
NS IRRIG 1000ML POUR BTL (IV SOLUTION) ×2 IMPLANT
PAD ARMBOARD 7.5X6 YLW CONV (MISCELLANEOUS) ×4 IMPLANT
POUCH RETRIEVAL ECOSAC 10 (ENDOMECHANICALS) ×1 IMPLANT
POUCH RETRIEVAL ECOSAC 10MM (ENDOMECHANICALS) ×2
RELOAD 45 VASCULAR/THIN (ENDOMECHANICALS) IMPLANT
RELOAD STAPLE 45 2.5 WHT GRN (ENDOMECHANICALS) IMPLANT
RELOAD STAPLE 45 3.5 BLU ETS (ENDOMECHANICALS) IMPLANT
RELOAD STAPLE TA45 3.5 REG BLU (ENDOMECHANICALS) ×2 IMPLANT
SCISSORS LAP 5X35 DISP (ENDOMECHANICALS) IMPLANT
SET IRRIG TUBING LAPAROSCOPIC (IRRIGATION / IRRIGATOR) ×1 IMPLANT
SET TUBE SMOKE EVAC HIGH FLOW (TUBING) ×2 IMPLANT
SHEARS HARMONIC ACE PLUS 36CM (ENDOMECHANICALS) ×1 IMPLANT
SLEEVE ENDOPATH XCEL 5M (ENDOMECHANICALS) ×2 IMPLANT
SPECIMEN JAR SMALL (MISCELLANEOUS) ×2 IMPLANT
STRIP CLOSURE SKIN 1/2X4 (GAUZE/BANDAGES/DRESSINGS) ×2 IMPLANT
SUT MNCRL AB 4-0 PS2 18 (SUTURE) ×2 IMPLANT
SUT VICRYL 0 UR6 27IN ABS (SUTURE) ×3 IMPLANT
TOWEL GREEN STERILE (TOWEL DISPOSABLE) ×2 IMPLANT
TOWEL GREEN STERILE FF (TOWEL DISPOSABLE) ×2 IMPLANT
TRAY FOLEY MTR SLVR 16FR STAT (SET/KITS/TRAYS/PACK) ×1 IMPLANT
TRAY LAPAROSCOPIC MC (CUSTOM PROCEDURE TRAY) ×2 IMPLANT
TROCAR XCEL BLUNT TIP 100MML (ENDOMECHANICALS) ×2 IMPLANT
TROCAR XCEL NON-BLD 5MMX100MML (ENDOMECHANICALS) ×2 IMPLANT
WATER STERILE IRR 1000ML POUR (IV SOLUTION) ×1 IMPLANT

## 2020-11-29 NOTE — Op Note (Signed)
Preoperative diagnosis: appendicitis Postoperative diagnosis: saa Procedure: laparoscopic appendectomy Surgeon: Dr Harden Mo EBL: minimal Anesthesia: general Drains none Complications none Specimens appendix to pathology Sponge and needle count correct  dispo recovery stable.  Indications: This is a 24 yom with rlq pain and tenderness, elevated wbc and ct with appendicitis.  Discussed lap appy.  Procedure: After informed consent was obtained the patient was taken to the operating room.  He was given antibiotics.  SCDs were in place.  He was placed under general esthesia without complication.  He was prepped and draped in the standard sterile surgical fashion.  A surgical timeout was then performed.  I made a vertical incision after infiltrating Marcaine below the umbilicus.  I took this down to the fascia.  I grasped the fascia.  I incised this sharply.  I entered the peritoneum bluntly.  I then placed a 0 Vicryl pursestring suture through the fascia.  I inserted a Hassan trocar and insufflated the abdomen to 15 mmHg of pressure.  I then inserted 2 further 5 mm trochars in the suprapubic region and left lower quadrant under direct vision.  I then noted the terminal ileum and cecum.  I rolled the cecum medially and noted the appendiceal base.  His appendix was acute suppurative appendicitis.  I was able to dissect the base.  I then divided the appendiceal mesentery with the harmonic scalpel.  I then divided the appendix at the base with a stapler.  I placed this in a retrieval bag and removed from the abdomen.  There was hemostasis.  I then removed my Hassan trocar.  I tied the pursestring down.  I placed 2 additional 0 Vicryl sutures to completely obliterate that defect.  I then desufflated the abdomen to remove the remaining trochars.  I closed these with 4 Monocryl and glue.  He tolerated this well was extubated and transferred to recovery stable.

## 2020-11-29 NOTE — Transfer of Care (Signed)
Immediate Anesthesia Transfer of Care Note  Patient: Bob [name]  Procedure(s) Performed: APPENDECTOMY LAPAROSCOPIC (N/A )  Patient Location: PACU  Anesthesia Type:General  Level of Consciousness: awake, drowsy and patient cooperative  Airway & Oxygen Therapy: Patient Spontanous Breathing and Patient connected to face mask oxygen  Post-op Assessment: Report given to RN and Post -op Vital signs reviewed and stable  Post vital signs: Reviewed and stable  Last Vitals:  Vitals Value Taken Time  BP 122/75 11/29/20 1448  Temp    Pulse 84 11/29/20 1453  Resp 17 11/29/20 1453  SpO2 100 % 11/29/20 1453  Vitals shown include unvalidated device data.  Last Pain:  Vitals:   11/29/20 0947  TempSrc:   PainSc: 0-No pain         Complications: No complications documented.

## 2020-11-29 NOTE — Discharge Summary (Signed)
Physician Discharge Summary  Patient ID: Bob [name] MRN: [id number] DOB/AGE: 1996/07/12 25 y.o.  Admit date: 11/28/2020 Discharge date: 11/28/2020  Admission Diagnoses:  Acute Appendicitis   Discharge Diagnoses:  Acute appendicitis  Active Problems:   * No active hospital problems. *   PROCEDURES: laparoscopic appendectomy, 11/29/20  Hospital Course:  Pt is a 25 y/o male presented with RLQ pain, and nausea. No vomiting or diarrhea.  He thought it was food poisioning, pain mid abdomen originally, then moved to RLQ.  No fever. He reports what he thought he might have had some blood in his stool about 6 weeks ago but none recently. NO hx of hemorrhoids.   Work up shows he is afebrile, VSS.  WBC 15.4, CT shows: No obstruction. Mild fat stranding around the appendix which shows prominent wall thickness/enhancement. Outer wall diameter is only up to 8 mm. No stone, abscess, or perforation. The appendix is in the right lower quadrant and retrocecal.  Pt was seen in the ED and taken to the OR.  He underwent laparoscopic appendectomy and did well and was discharged from the PACU.    Condition on DC:  Improved   CBC Latest Ref Rng & Units 11/28/2020 04/27/2015 02/05/2013  WBC 4.0 - 10.5 K/uL 15.4(H) 6.3 7.3  Hemoglobin 13.0 - 17.0 g/dL 70.9 62.8 36.6  Hematocrit 39.0 - 52.0 % 42.9 45.7 42.2  Platelets 150 - 400 K/uL 302 257.0 221   CMP Latest Ref Rng & Units 11/28/2020 04/27/2015 02/05/2013  Glucose 70 - 99 mg/dL 294(T) 654(Y) 90  BUN 6 - 20 mg/dL 13 17 14   Creatinine 0.61 - 1.24 mg/dL 5.03 5.46  Sodium 135 - 145 mmol/L 140 140 140  Potassium 3.5 - 5.1 mmol/L 3.6 4.3 3.9  Chloride 98 - 111 mmol/L 100 102 103  CO2 22 - 32 mmol/L 27 33(H) 29  Calcium 8.9 - 10.3 mg/dL 9.7 9.8 9.6  Total Protein 6.5 - 8.1 g/dL 7.0 7.5 7.7  Total Bilirubin 0.3 - 1.2 mg/dL 1.0 0.9 0.5  Alkaline Phos 38 - 126 U/L 51 60 66  AST 15 - 41 U/L 32 20 26  ALT 0 - 44 U/L 35 17 25    Disposition:  Discharge disposition: 01-Home or Self Care        Allergies as of 11/29/2020      Reactions   Codeine Rash   Sulfa Antibiotics Rash      Medication List    TAKE these medications   amphetamine-dextroamphetamine 20 MG 24 hr capsule Commonly known as: ADDERALL XR Take 1 capsule (20 mg total) by mouth every morning.   oxyCODONE 5 MG immediate release tablet Commonly known as: Oxy IR/ROXICODONE Take 1 tablet (5 mg total) by mouth every 6 (six) hours as needed for moderate pain, severe pain or breakthrough pain.       Follow-up Information    [facility] 01/27/2021 Follow up on 12/21/2020.   Specialty: General Surgery Why: Your appointment is at 1:30 PM.  Be at the office 30 minutes early for check-in.  Bring photo ID and insurance information. Contact information: [address] [place] [phone]               Signed: [phone] 11/30/2020, 12:55 PM

## 2020-11-29 NOTE — ED Notes (Signed)
Consent signed and at bedside  

## 2020-11-29 NOTE — Anesthesia Preprocedure Evaluation (Signed)
Anesthesia Evaluation  Patient identified by MRN, date of birth, ID band Patient awake    Reviewed: Allergy & Precautions, NPO status , Patient's Chart, lab work & pertinent test results  Airway Mallampati: II  TM Distance: >3 FB Neck ROM: Full    Dental no notable dental hx.    Pulmonary asthma (childhood) ,    Pulmonary exam normal breath sounds clear to auscultation       Cardiovascular Exercise Tolerance: Good Normal cardiovascular exam Rhythm:Regular Rate:Normal     Neuro/Psych PSYCHIATRIC DISORDERS Depression    GI/Hepatic Neg liver ROS, Acute appendicitis   Endo/Other  negative endocrine ROS  Renal/GU negative Renal ROS     Musculoskeletal negative musculoskeletal ROS (+)   Abdominal Normal abdominal exam  (+)   Peds negative pediatric ROS (+)  Hematology negative hematology ROS (+)   Anesthesia Other Findings   Reproductive/Obstetrics negative OB ROS                             Anesthesia Physical Anesthesia Plan  ASA: II and emergent  Anesthesia Plan: General   Post-op Pain Management:    Induction:   PONV Risk Score and Plan: 2 and Midazolam, Dexamethasone, Ondansetron and Treatment may vary due to age or medical condition  Airway Management Planned: Oral ETT  Additional Equipment: None  Intra-op Plan:   Post-operative Plan: Extubation in OR  Informed Consent: I have reviewed the patients History and Physical, chart, labs and discussed the procedure including the risks, benefits and alternatives for the proposed anesthesia with the patient or authorized representative who has indicated his/her understanding and acceptance.     Dental advisory given  Plan Discussed with: CRNA and Anesthesiologist  Anesthesia Plan Comments:         Anesthesia Quick Evaluation

## 2020-11-29 NOTE — Anesthesia Postprocedure Evaluation (Signed)
Anesthesia Post Note  Patient: Bob [name]  Procedure(s) Performed: APPENDECTOMY LAPAROSCOPIC (N/A )     Patient location during evaluation: PACU Anesthesia Type: General Level of consciousness: awake Pain management: pain level controlled Vital Signs Assessment: post-procedure vital signs reviewed and stable Respiratory status: spontaneous breathing and respiratory function stable Cardiovascular status: stable Postop Assessment: no apparent nausea or vomiting Anesthetic complications: no   No complications documented.  Last Vitals:  Vitals:   11/29/20 1545 11/29/20 1600  BP:  118/79  Pulse: 83 88  Resp: (!) 21 (!) 22  Temp:  (!) 36.2 C  SpO2: 99% 97%    Last Pain:  Vitals:   11/29/20 0947  TempSrc:   PainSc: 0-No pain                 [name]

## 2020-11-29 NOTE — Discharge Instructions (Signed)
[facility] ______[facility] °LAPAROSCOPIC SURGERY: POST OP INSTRUCTIONS °Always review your discharge instruction sheet given to you by the facility where your surgery was performed. °IF YOU HAVE DISABILITY OR FAMILY LEAVE FORMS, YOU MUST BRING THEM TO THE OFFICE FOR PROCESSING.   °DO NOT GIVE THEM TO YOUR DOCTOR. ° °1. A prescription for pain medication may be given to you upon discharge.  Take your pain medication as prescribed, if needed.  If narcotic pain medicine is not needed, then you may take acetaminophen (Tylenol) or ibuprofen (Advil) as needed. °2. Take your usually prescribed medications unless otherwise directed. °3. If you need a refill on your pain medication, please contact your pharmacy.  They will contact our office to request authorization. Prescriptions will not be filled after 5pm or on week-ends. °4. You should follow a light diet the first few days after arrival home, such as soup and crackers, etc.  Be sure to include lots of fluids daily. °5. Most patients will experience some swelling and bruising in the area of the incisions.  Ice packs will help.  Swelling and bruising can take several days to resolve.  °6. It is common to experience some constipation if taking pain medication after surgery.  Increasing fluid intake and taking a stool softener (such as Colace) will usually help or prevent this problem from occurring.  A mild laxative (Milk of Magnesia or Miralax) should be taken according to package instructions if there are no bowel movements after 48 hours. °7. Unless discharge instructions indicate otherwise, you may remove your bandages 24-48 hours after surgery, and you may shower at that time.  You may have steri-strips (small skin tapes) in place directly over the incision.  These strips should be left on the skin for 7-10 days.  If your surgeon used skin glue on the incision, you may shower in 24 hours.  The glue will flake off over the next 2-3 weeks.  Any sutures or  staples will be removed at the office during your follow-up visit. °8. ACTIVITIES:  You may resume regular (light) daily activities beginning the next day--such as daily self-care, walking, climbing stairs--gradually increasing activities as tolerated.  You may have sexual intercourse when it is comfortable.  Refrain from any heavy lifting or straining until approved by your doctor. °a. You may drive when you are no longer taking prescription pain medication, you can comfortably wear a seatbelt, and you can safely maneuver your car and apply brakes. °b. RETURN TO WORK:  __________________________________________________________ °9. You should see your doctor in the office for a follow-up appointment approximately 2-3 weeks after your surgery.  Make sure that you call for this appointment within a day or two after you arrive home to insure a convenient appointment time. °10. OTHER INSTRUCTIONS: __________________________________________________________________________________________________________________________ __________________________________________________________________________________________________________________________ °WHEN TO CALL YOUR DOCTOR: °1. Fever over 101.0 °2. Inability to urinate °3. Continued bleeding from incision. °4. Increased pain, redness, or drainage from the incision. °5. Increasing abdominal pain ° °The clinic staff is available to answer your questions during regular business hours.  Please don’t hesitate to call and ask to speak to one of the nurses for clinical concerns.  If you have a medical emergency, go to the nearest emergency room or call 911.  A surgeon from [facility] is always on call at the hospital. °[address], [address], [place], [place]  [address] ? [address] [address], [place], [place]   [address] °[phone] ? [phone] ? FAX [phone] °Web site:   [email or web address] °

## 2020-11-29 NOTE — Anesthesia Procedure Notes (Signed)
Procedure Name: Intubation Date/Time: 11/29/2020 1:46 PM Performed by: Adria Dill, CRNA Pre-anesthesia Checklist: Patient identified, Emergency Drugs available, Suction available and Patient being monitored Patient Re-evaluated:Patient Re-evaluated prior to induction Oxygen Delivery Method: Circle system utilized Preoxygenation: Pre-oxygenation with 100% oxygen Induction Type: IV induction and Rapid sequence Laryngoscope Size: Miller and 2 Grade View: Grade I Tube type: Oral Tube size: 7.5 mm Number of attempts: 1 Airway Equipment and Method: Stylet and Oral airway Placement Confirmation: ETT inserted through vocal cords under direct vision,  positive ETCO2 and breath sounds checked- equal and bilateral Secured at: 21 cm Tube secured with: Tape Dental Injury: Teeth and Oropharynx as per pre-operative assessment

## 2020-11-29 NOTE — H&P (Signed)
[facility] H&P  Bridger Pizzi [facility] 06/21/1996  [id number].    Requesting MD: Pricilla Loveless Chief Complaint: RLQ pain Reason for Consult: appendicitis  HPI:  Pt is a 25 y/o male presented with RLQ pain, and nausea. No vomiting or diarrhea.  He thought it was food poisioning, pain mid abdomen originally, then moved to RLQ.  No fever. He reports what he thought he might have had some blood in his stool about 6 weeks ago but none recently. NO hx of hemorrhoids.    Work up shows he is afebrile, VSS.  WBC 15.4, CT shows: No obstruction. Mild fat stranding around the appendix which shows prominent wall thickness/enhancement. Outer wall diameter is only up to 8 mm. No stone, abscess, or perforation. The appendix is in the right lower quadrant and retrocecal.  ROS: ROS  Family History  Problem Relation Age of Onset  . Cancer Paternal Aunt        colon  . Heart disease Maternal Grandmother   . Hypertension Maternal Grandfather   . Hyperlipidemia Maternal Grandfather   . Diabetes Maternal Grandfather   . Alcohol abuse Paternal Grandmother   . Cancer Paternal Grandmother        lung    Past Medical History:  Diagnosis Date  . ADD (attention deficit disorder)   . Broken arm 2002   right  . Depression     Past Surgical History:  Procedure Laterality Date  . broken arm Right    pins at age 44    Social History:  reports that he has never smoked. He has never used smokeless tobacco. He reports current alcohol use of about 6.0 standard drinks of alcohol per week. He reports that he does not use drugs. EtOH:6-12 pack of beer per week Drugs: None Tobacco: None He works for [employer],   Allergies:  Allergies  Allergen Reactions  . Codeine Rash  . Sulfa Antibiotics Rash    Prior to Admission medications   Medication Sig Start Date End Date Taking? Authorizing Provider  amphetamine-dextroamphetamine (ADDERALL XR) 20 MG 24 hr capsule Take 1 capsule (20  mg total) by mouth every morning. 10/09/20  Yes Burchette, Elberta Fortis, MD     Blood pressure (!) 148/90, pulse 72, temperature 98.4 F (36.9 C), temperature source Oral, resp. rate 18, SpO2 100 %. Physical Exam:  General: pleasant, WD, WN white male who is laying in bed in NAD HEENT: head is normocephalic, atraumatic.  Sclera are noninjected.  Pupils are equal.  Ears and nose without any masses or lesions.  Mouth is pink and moist Heart: regular, rate, and rhythm.  Normal s1,s2. No obvious murmurs, gallops, or rubs noted.  Palpable radial and pedal pulses bilaterally Lungs: CTAB, no wheezes, rhonchi, or rales noted.  Respiratory effort nonlabored Abd: soft, some tenderness on the right side, ND, +BS, no masses, hernias, or organomegaly MS: all 4 extremities are symmetrical with no cyanosis, clubbing, or edema. Skin: warm and dry with no masses, lesions, or rashes Neuro: Cranial nerves 2-12 grossly intact, sensation is normal throughout Psych: A&Ox3 with an appropriate affect.   Results for orders placed or performed during the hospital encounter of 11/28/20 (from the past 48 hour(s))  Lipase, blood     Status: None   Collection Time: 11/28/20 10:16 PM  Result Value Ref Range   Lipase 25 11 - 51 U/L    Comment: Performed at [facility], [address], [place], [place] [address]  Comprehensive metabolic  panel     Status: Abnormal   Collection Time: 11/28/20 10:16 PM  Result Value Ref Range   Sodium 140 135 - 145 mmol/L   Potassium 3.6 3.5 - 5.1 mmol/L   Chloride 100 98 - 111 mmol/L   CO2 27 22 - 32 mmol/L   Glucose, Bld 119 (H) 70 - 99 mg/dL    Comment: Glucose reference range applies only to samples taken after fasting for at least 8 hours.   BUN 13 6 - 20 mg/dL   Creatinine, Ser 7.41 0.61 - 1.24 mg/dL   Calcium 9.7 8.9 - 28.7 mg/dL   Total Protein 7.0 6.5 - 8.1 g/dL   Albumin 4.3 3.5 - 5.0 g/dL   AST 32 15 - 41 U/L   ALT 35 0 - 44 U/L   Alkaline Phosphatase 51 38 - 126 U/L    Total Bilirubin 1.0 0.3 - 1.2 mg/dL   GFR, Estimated >86 >76 mL/min    Comment: (NOTE) Calculated using the CKD-EPI Creatinine Equation (2021)    Anion gap 13 5 - 15    Comment: Performed at [facility], [address] [place], [place] [address]  CBC     Status: Abnormal   Collection Time: 11/28/20 10:16 PM  Result Value Ref Range   WBC 15.4 (H) 4.0 - 10.5 K/uL   RBC 5.15 4.22 - 5.81 MIL/uL   Hemoglobin 14.9 13.0 - 17.0 g/dL   HCT 70.9 62.8 - 36.6 %   MCV 83.3 80.0 - 100.0 fL   MCH 28.9 26.0 - 34.0 pg   MCHC 34.7 30.0 - 36.0 g/dL   RDW 29.4 76.5 - 46.5 %   Platelets 302 150 - 400 K/uL   nRBC 0.0 0.0 - 0.2 %    Comment: Performed at [facility], [address] [place], [place] [address]  Urinalysis, Routine w reflex microscopic Urine, Clean Catch     Status: Abnormal   Collection Time: 11/28/20 10:29 PM  Result Value Ref Range   Color, Urine YELLOW YELLOW   APPearance HAZY (A) CLEAR   Specific Gravity, Urine 1.010 1.005 - 1.030   pH 7.0 5.0 - 8.0   Glucose, UA 100 (A) NEGATIVE mg/dL   Hgb urine dipstick NEGATIVE NEGATIVE   Bilirubin Urine NEGATIVE NEGATIVE   Ketones, ur NEGATIVE NEGATIVE mg/dL   Protein, ur NEGATIVE NEGATIVE mg/dL   Nitrite NEGATIVE NEGATIVE   Leukocytes,Ua NEGATIVE NEGATIVE    Comment: Microscopic not done on urines with negative protein, blood, leukocytes, nitrite, or glucose < 500 mg/dL. Performed at [facility], [address], [place], [place] [address]    CT ABDOMEN PELVIS W CONTRAST  Result Date: 11/29/2020 CLINICAL DATA:  Right lower quadrant abdominal pain since last night EXAM: CT ABDOMEN AND PELVIS WITH CONTRAST TECHNIQUE: Multidetector CT imaging of the abdomen and pelvis was performed using the standard protocol following bolus administration of intravenous contrast. CONTRAST:  OMNIPAQUE IOHEXOL 300 MG/ML  SOLN COMPARISON:  04/09/2010 FINDINGS: Lower chest:  No contributory findings. Hepatobiliary: Hepatic  steatosis.No evidence of biliary obstruction or stone. Pancreas: Unremarkable. Spleen: Unremarkable. Adrenals/Urinary Tract: Negative adrenals. No hydronephrosis or stone. Unremarkable bladder. Stomach/Bowel: No obstruction. Mild fat stranding around the appendix which shows prominent wall thickness/enhancement. Outer wall diameter is only up to 8 mm. No stone, abscess, or perforation. The appendix is in the right lower quadrant and retrocecal. Vascular/Lymphatic: No acute vascular abnormality. No mass or adenopathy. Reproductive:No pathologic findings. Other: No ascites  or pneumoperitoneum. Musculoskeletal: No acute abnormalities. IMPRESSION: In the appropriate clinical setting, there are findings of early acute appendicitis. No appendicolith. Electronically Signed   By: Marnee Spring M.D.   On: 11/29/2020 07:42      Assessment/Plan Hx ADD  Acute appendicitis  FEN: N.p.o./IV fluids ID: Rocephin/Flagyl DVT: SCDs Follow-up: [facility]  Plan: Patient been seen and evaluated symptoms are consistent with early acute appendicitis.  Patient is n.p.o., will start IV fluids, he is already been started on IV antibiotics; plan surgery later this afternoon.  Risk and benefits were discussed with the patient in detail informed consent was obtained.  Also recommended he follow-up with his primary care about the possible blood in his stool.   Sherrie George [facility] 11/29/2020, 8:51 AM Please see Amion for pager number during day hours 7:00am-4:30pm

## 2020-11-29 NOTE — OR Nursing (Signed)
Pt is awake,alert and oriented. Pt is in NAD at this time. Pt and family verbalized understanding of poc and discharge instructions. instructions given to family and reviewed prior to discharge.Pt is ambulatory to wheelchair with stand by assist but not needed with steady gait.  Pt taken to front lobby and placed in car with family.   

## 2020-11-29 NOTE — ED Provider Notes (Signed)
[facility] EMERGENCY DEPARTMENT Provider Note   CSN: [id number] Arrival date & time: 11/28/20  2149     History Chief Complaint  Patient presents with  . Abdominal Pain    Bob [name] is a 25 y.o. male.  HPI 25 year old male presents with acute right lower quadrant abdominal pain.  Overall it started last evening.  At first it was generalized and he thought he might have food poisoning.  It has moved into his right lower quadrant.  It was a sharp pain and 650 mg Tylenol improved it and right now is about a 1 or 2.  Still remains in his right lower quadrant.  No back pain or urinary symptoms.  No vomiting.  He has not felt hungry.  No fevers.  Past Medical History:  Diagnosis Date  . ADD (attention deficit disorder)   . Broken arm 2002   right  . Depression     Patient Active Problem List   Diagnosis Date Noted  . Sinusitis 07/11/2015  . Acne 04/29/2013  . ADD (attention deficit disorder) 03/30/2013  . MDD (major depressive disorder), single episode, severe (HCC) 02/08/2013  . Mild intermittent asthma 01/22/2013    Past Surgical History:  Procedure Laterality Date  . broken arm Right    pins at age 37       Family History  Problem Relation Age of Onset  . Cancer Paternal Aunt        colon  . Heart disease Maternal Grandmother   . Hypertension Maternal Grandfather   . Hyperlipidemia Maternal Grandfather   . Diabetes Maternal Grandfather   . Alcohol abuse Paternal Grandmother   . Cancer Paternal Grandmother        lung    Social History   Tobacco Use  . Smoking status: Never Smoker  . Smokeless tobacco: Never Used  Vaping Use  . Vaping Use: Never used  Substance Use Topics  . Alcohol use: Yes    Alcohol/week: 6.0 standard drinks    Types: 6 Cans of beer per week  . Drug use: No    Home Medications Prior to Admission medications   Medication Sig Start Date End Date Taking? Authorizing Provider   amphetamine-dextroamphetamine (ADDERALL XR) 20 MG 24 hr capsule Take 1 capsule (20 mg total) by mouth every morning. 10/09/20  Yes Burchette, Elberta Fortis, MD    Allergies    Codeine and Sulfa antibiotics  Review of Systems   Review of Systems  Constitutional: Negative for fever.  Gastrointestinal: Positive for abdominal pain. Negative for vomiting.  Genitourinary: Negative for dysuria.  Musculoskeletal: Negative for back pain.  All other systems reviewed and are negative.   Physical Exam Updated Vital Signs BP (!) 134/91   Pulse 79   Temp 98.4 F (36.9 C) (Oral)   Resp 18   SpO2 100%   Physical Exam Vitals and nursing note reviewed.  Constitutional:      General: He is not in acute distress.    Appearance: He is well-developed and well-nourished. He is not ill-appearing or diaphoretic.  HENT:     Head: Normocephalic and atraumatic.     Right Ear: External ear normal.     Left Ear: External ear normal.     Nose: Nose normal.  Eyes:     General:        Right eye: No discharge.        Left eye: No discharge.  Cardiovascular:     Rate and Rhythm:  Normal rate and regular rhythm.     Heart sounds: Normal heart sounds.  Pulmonary:     Effort: Pulmonary effort is normal.     Breath sounds: Normal breath sounds.  Abdominal:     Palpations: Abdomen is soft.     Tenderness: There is abdominal tenderness in the right lower quadrant.  Musculoskeletal:        General: No edema.     Cervical back: Neck supple.  Skin:    General: Skin is warm and dry.  Neurological:     Mental Status: He is alert.  Psychiatric:        Mood and Affect: Mood is not anxious.     ED Results / Procedures / Treatments   Labs (all labs ordered are listed, but only abnormal results are displayed) Labs Reviewed  COMPREHENSIVE METABOLIC PANEL - Abnormal; Notable for the following components:      Result Value   Glucose, Bld 119 (*)    All other components within normal limits  CBC - Abnormal;  Notable for the following components:   WBC 15.4 (*)    All other components within normal limits  URINALYSIS, ROUTINE W REFLEX MICROSCOPIC - Abnormal; Notable for the following components:   APPearance HAZY (*)    Glucose, UA 100 (*)    All other components within normal limits  SARS CORONAVIRUS 2 BY RT PCR (HOSPITAL ORDER, PERFORMED IN [facility])  LIPASE, BLOOD    EKG None  Radiology CT ABDOMEN PELVIS W CONTRAST  Result Date: 11/29/2020 CLINICAL DATA:  Right lower quadrant abdominal pain since last night EXAM: CT ABDOMEN AND PELVIS WITH CONTRAST TECHNIQUE: Multidetector CT imaging of the abdomen and pelvis was performed using the standard protocol following bolus administration of intravenous contrast. CONTRAST:  OMNIPAQUE IOHEXOL 300 MG/ML  SOLN COMPARISON:  04/09/2010 FINDINGS: Lower chest:  No contributory findings. Hepatobiliary: Hepatic steatosis.No evidence of biliary obstruction or stone. Pancreas: Unremarkable. Spleen: Unremarkable. Adrenals/Urinary Tract: Negative adrenals. No hydronephrosis or stone. Unremarkable bladder. Stomach/Bowel: No obstruction. Mild fat stranding around the appendix which shows prominent wall thickness/enhancement. Outer wall diameter is only up to 8 mm. No stone, abscess, or perforation. The appendix is in the right lower quadrant and retrocecal. Vascular/Lymphatic: No acute vascular abnormality. No mass or adenopathy. Reproductive:No pathologic findings. Other: No ascites or pneumoperitoneum. Musculoskeletal: No acute abnormalities. IMPRESSION: In the appropriate clinical setting, there are findings of early acute appendicitis. No appendicolith. Electronically Signed   By: Marnee Spring M.D.   On: 11/29/2020 07:42    Procedures Procedures   Medications Ordered in ED Medications  cefTRIAXone (ROCEPHIN) 2 g in sodium chloride 0.9 % 100 mL IVPB (0 g Intravenous Stopped 11/29/20 0915)    And  metroNIDAZOLE (FLAGYL) IVPB 500 mg (500 mg  Intravenous New Bag/Given 11/29/20 0836)  iohexol (OMNIPAQUE) 300 MG/ML solution 100 mL (100 mLs Intravenous Contrast Given 11/29/20 0715)    ED Course  I have reviewed the triage vital signs and the nursing notes.  Pertinent labs & imaging results that were available during my care of the patient were reviewed by me and considered in my medical decision making (see chart for details).    MDM Rules/Calculators/A&P                          Patient's presentation is most consistent with acute appendicitis.  CT shows what appears to be early appendicitis and along with his elevated WBC  I think this is the most likely cause.  He was given Rocephin/Flagyl.  He declined anything for pain.  I have personally reviewed the CT images and the labs.  General surgery to admit. Final Clinical Impression(s) / ED Diagnoses Final diagnoses:  Acute appendicitis with localized peritonitis, without perforation, abscess, or gangrene    Rx / DC Orders ED Discharge Orders    None       Pricilla Loveless, MD 11/29/20 [phone]

## 2020-11-30 ENCOUNTER — Encounter (HOSPITAL_COMMUNITY): Payer: Self-pay | Admitting: General Surgery

## 2020-11-30 LAB — SURGICAL PATHOLOGY

## 2020-12-04 ENCOUNTER — Other Ambulatory Visit: Payer: Self-pay

## 2020-12-04 ENCOUNTER — Ambulatory Visit (INDEPENDENT_AMBULATORY_CARE_PROVIDER_SITE_OTHER): Payer: No Typology Code available for payment source | Admitting: *Deleted

## 2020-12-04 DIAGNOSIS — B070 Plantar wart: Secondary | ICD-10-CM

## 2020-12-04 NOTE — Progress Notes (Signed)
Patient presents today for laser treatment for plantar warts on the right foot.   There are 2 lesions, one large area plantar hallux and one smaller area plantar lateral. Some improvement.  Dr. Logan Bores patient.  All other systems are negative.  Lesions were debrided superficially. Laser therapy was administered to the right foot. The patient tolerated the treatment well. All safety precautions were in place.   Follow up in 2 weeks for re-evaluation with Dr. Logan Bores.

## 2020-12-06 ENCOUNTER — Encounter: Payer: Self-pay | Admitting: Family Medicine

## 2020-12-07 MED ORDER — AMPHETAMINE-DEXTROAMPHETAMINE 20 MG PO TABS: 20.0000 mg | ORAL_TABLET | Freq: Every day | ORAL | 0 refills | Status: DC

## 2020-12-07 NOTE — Telephone Encounter (Signed)
I sent in one refill of adderall 20 mg .  Look's like last visit was 3/21 so recommend set up follow up in the next month.

## 2020-12-25 ENCOUNTER — Ambulatory Visit: Payer: No Typology Code available for payment source | Admitting: Podiatry

## 2020-12-25 DIAGNOSIS — B070 Plantar wart: Secondary | ICD-10-CM | POA: Diagnosis not present

## 2020-12-25 NOTE — Discharge Summary (Signed)
Patient ID: Bob [name] MRN: [id number] DOB/AGE: [birth year] 24 y.o.   Admit date: 11/28/2020 Discharge date: 11/28/2020   Admission Diagnoses:  Acute Appendicitis     Discharge Diagnoses:  Acute appendicitis   Active Problems:   * No active hospital problems. *     PROCEDURES: laparoscopic appendectomy, 11/29/20   Hospital Course:  Pt is a 25 y/o male presented with RLQ pain, and nausea. No vomiting or diarrhea.  He thought it was food poisioning, pain mid abdomen originally, then moved to RLQ.  No fever. He reports what he thought he might have had some blood in his stool about 6 weeks ago but none recently. NO hx of hemorrhoids.   Work up shows he is afebrile, VSS.  WBC 15.4, CT shows: No obstruction. Mild fat stranding around the appendix which shows prominent wall thickness/enhancement. Outer wall diameter is only up to 8 mm. No stone, abscess, or perforation. The appendix is in the right lower quadrant and retrocecal.   Pt was seen in the ED and taken to the OR.  He underwent laparoscopic appendectomy and did well and was discharged from the PACU.     Condition on DC:  Improved     CBC Latest Ref Rng & Units 11/28/2020 04/27/2015 02/05/2013  WBC 4.0 - 10.5 K/uL 15.4(H) 6.3 7.3  Hemoglobin 13.0 - 17.0 g/dL 37.8 58.8 50.2  Hematocrit 39.0 - 52.0 % 42.9 45.7 42.2  Platelets 150 - 400 K/uL 302 257.0 221    CMP Latest Ref Rng & Units 11/28/2020 04/27/2015 02/05/2013  Glucose 70 - 99 mg/dL 774(J) 287(O) 90  BUN 6 - 20 mg/dL 13 17 14   Creatinine 0.61 - 1.24 mg/dL 6.76 7.20  Sodium 135 - 145 mmol/L 140 140 140  Potassium 3.5 - 5.1 mmol/L 3.6 4.3 3.9  Chloride 98 - 111 mmol/L 100 102 103  CO2 22 - 32 mmol/L 27 33(H) 29  Calcium 8.9 - 10.3 mg/dL 9.7 9.8 9.6  Total Protein 6.5 - 8.1 g/dL 7.0 7.5 7.7  Total Bilirubin 0.3 - 1.2 mg/dL 1.0 0.9 0.5  Alkaline Phos 38 - 126 U/L 51 60 66  AST 15 - 41 U/L 32 20 26  ALT 0 - 44 U/L 35 17 25      Disposition: Discharge disposition:  01-Home or Self Care                  Allergies as of 11/29/2020       Reactions    Codeine Rash    Sulfa Antibiotics Rash           Medication List     TAKE these medications   amphetamine-dextroamphetamine 20 MG 24 hr capsule Commonly known as: ADDERALL XR Take 1 capsule (20 mg total) by mouth every morning.    oxyCODONE 5 MG immediate release tablet Commonly known as: Oxy IR/ROXICODONE Take 1 tablet (5 mg total) by mouth every 6 (six) hours as needed for moderate pain, severe pain or breakthrough pain.               Follow-up Information         [facility] 01/27/2021 Follow up on 12/21/2020.   Specialty: General Surgery Why: Your appointment is at 1:30 PM.  Be at the office 30 minutes early for check-in.  Bring photo ID and insurance information. Contact information: [address] [place] [phone]  SignedSherrie George 11/30/2020, 12:55 PM

## 2020-12-25 NOTE — Progress Notes (Signed)
° °  Subjective: 25 y.o. male presenting today for follow-up evaluation of plantar verruca to the right great toe this been going on for approximately 5 years now. Patient just recently finished 4 sessions of laser treatment to the plantar verruca. He presents for follow-up treatment evaluation   Past Medical History:  Diagnosis Date   ADD (attention deficit disorder)    Broken arm 2002   right   Depression     Objective: Physical Exam General: The patient is alert and oriented x3 in no acute distress.   Dermatology: Hyperkeratotic skin lesion(s) noted to the plantar aspect of the right foot approximately 1 cm in diameter. Pinpoint bleeding noted upon debridement. Skin is warm, dry and supple bilateral lower extremities. Negative for open lesions or macerations.   Vascular: Palpable pedal pulses bilaterally. No edema or erythema noted. Capillary refill within normal limits.   Neurological: Epicritic and protective threshold grossly intact bilaterally.    Musculoskeletal Exam: Pain on palpation to the noted skin lesion(s).  Range of motion within normal limits to all pedal and ankle joints bilateral. Muscle strength 5/5 in all groups bilateral.    Assessment: #1 plantar wart right great toe; superficial spreading type #2 pain in right foot     Plan of Care:  #1 Patient was evaluated. #2 Excisional debridement of the plantar wart lesion(s) was performed using a chisel blade.   #3 Salicylic acid applied #4 continue Salinocaine daily x4 weeks #5 return to clinic in 4 weeks    Felecia Shelling, DPM [facility]  Dr. Felecia Shelling, DPM    [address] [place], [place] [address]                Office [phone]  Fax [phone]

## 2021-01-22 ENCOUNTER — Other Ambulatory Visit: Payer: Self-pay

## 2021-01-22 ENCOUNTER — Ambulatory Visit: Payer: No Typology Code available for payment source | Admitting: Podiatry

## 2021-01-22 DIAGNOSIS — B070 Plantar wart: Secondary | ICD-10-CM | POA: Diagnosis not present

## 2021-01-22 MED ORDER — FLUOROURACIL 5 % EX CREA: TOPICAL_CREAM | Freq: Two times a day (BID) | CUTANEOUS | 1 refills | Status: DC

## 2021-01-22 NOTE — Progress Notes (Signed)
   Subjective: 25 y.o. male presenting today for follow-up evaluation of plantar verruca to the right great toe this been going on for approximately 5 years now.  Patient states overall there is some significant improvement.  It is no longer painful.  He presents for further treatment evaluation.  No new complaints at this time  Past Medical History:  Diagnosis Date  . ADD (attention deficit disorder)   . Broken arm 2002   right  . Depression     Objective: Physical Exam General: The patient is alert and oriented x3 in no acute distress.   Dermatology: Hyperkeratotic skin lesion(s) noted to the plantar aspect of the right foot approximately 1 cm in diameter. Pinpoint bleeding noted upon debridement. Skin is warm, dry and supple bilateral lower extremities. Negative for open lesions or macerations.   Vascular: Palpable pedal pulses bilaterally. No edema or erythema noted. Capillary refill within normal limits.   Neurological: Epicritic and protective threshold grossly intact bilaterally.    Musculoskeletal Exam: Pain on palpation to the noted skin lesion(s).  Range of motion within normal limits to all pedal and ankle joints bilateral. Muscle strength 5/5 in all groups bilateral.    Assessment: #1 plantar wart right great toe; superficial spreading type #2 pain in right foot     Plan of Care:  #1 Patient was evaluated. #2 Excisional debridement of the plantar wart lesion(s) was performed using a chisel blade.   #3 Salicylic acid applied #4 continue Salinocaine daily x4 weeks #5  Prescription for Efudex 5% cream apply 2 times daily  #6 return to clinic in 8 weeks  Felecia Shelling, DPM [facility]  Dr. Felecia Shelling, DPM    [address] [place], [place] [address]                Office [phone]  Fax [phone]

## 2021-01-30 ENCOUNTER — Encounter: Payer: Self-pay | Admitting: Family Medicine

## 2021-03-21 ENCOUNTER — Other Ambulatory Visit: Payer: Self-pay

## 2021-03-21 ENCOUNTER — Ambulatory Visit: Payer: No Typology Code available for payment source | Admitting: Podiatry

## 2021-03-21 DIAGNOSIS — B070 Plantar wart: Secondary | ICD-10-CM

## 2021-03-21 NOTE — Progress Notes (Signed)
   Subjective: 24 y.o. male presenting today for follow-up evaluation of plantar verruca to the right great toe this been going on for approximately 5 years now.  Patient states overall there is some significant improvement.  Patient continues to see improvement.  No new complaints at this time  Past Medical History:  Diagnosis Date  . ADD (attention deficit disorder)   . Broken arm 2002   right  . Depression     Objective: Physical Exam General: The patient is alert and oriented x3 in no acute distress.   Dermatology: Hyperkeratotic skin lesion(s) noted to the plantar aspect of the right foot approximately 1 cm in diameter. Pinpoint bleeding noted upon debridement. Skin is warm, dry and supple bilateral lower extremities. Negative for open lesions or macerations.   Vascular: Palpable pedal pulses bilaterally. No edema or erythema noted. Capillary refill within normal limits.   Neurological: Epicritic and protective threshold grossly intact bilaterally.    Musculoskeletal Exam: Pain on palpation to the noted skin lesion(s).  Range of motion within normal limits to all pedal and ankle joints bilateral. Muscle strength 5/5 in all groups bilateral.    Assessment: #1 plantar wart right great toe; superficial spreading type #2 pain in right foot     Plan of Care:  #1 Patient was evaluated. #2 Excisional debridement of the plantar wart lesion(s) was performed using a chisel blade.   #3 Salicylic acid applied #4 continue Salinocaine daily x4 weeks.  Additional salinocaine was provided #5 patient states that the Efudex 5% cream was ineffective.  Discontinue 6.  Return to clinic in 2 months   Felecia Shelling, DPM [facility]  Dr. Felecia Shelling, DPM    [address] [place], [place] [address]                Office [phone]  Fax [phone]

## 2021-05-21 ENCOUNTER — Ambulatory Visit: Payer: No Typology Code available for payment source | Admitting: Podiatry

## 2021-05-21 ENCOUNTER — Other Ambulatory Visit: Payer: Self-pay

## 2021-05-21 DIAGNOSIS — B070 Plantar wart: Secondary | ICD-10-CM

## 2021-05-21 NOTE — Progress Notes (Signed)
   HPI: 25 y.o. male presenting today for follow-up evaluation of plantar wart to the right great toe.  Patient states that the warts have completely resolved.  The Salinocaine (salicylic acid) that was provided for the patient last visit helped significantly.  He applied daily.  No new complaints at this time  Past Medical History:  Diagnosis Date   ADD (attention deficit disorder)    Broken arm 2002   right   Depression      Physical Exam: General: The patient is alert and oriented x3 in no acute distress.  Dermatology: Skin is warm, dry and supple bilateral lower extremities. Negative for open lesions or macerations.  Plantar verruca to the right great toe that has been present for the last 5 to 6 years to completely resolved.  Vascular: Palpable pedal pulses bilaterally. No edema or erythema noted. Capillary refill within normal limits.  Neurological: Epicritic and protective threshold grossly intact bilaterally.   Musculoskeletal Exam: Range of motion within normal limits to all pedal and ankle joints bilateral. Muscle strength 5/5 in all groups bilateral.   Assessment: 1.  Plantar verruca right great toe; resolved   Plan of Care:  1. Patient evaluated. 2.  Continue salinocaine daily as needed 3.  Recommend good foot hygiene and good shoes 4.  Return to clinic as needed      Felecia Shelling, DPM [facility]  Dr. Felecia Shelling, DPM    [address] [place], [place] [address]                Office [phone]  Fax [phone]

## 2021-08-01 ENCOUNTER — Ambulatory Visit (INDEPENDENT_AMBULATORY_CARE_PROVIDER_SITE_OTHER): Payer: 59 | Admitting: Family Medicine

## 2021-08-01 ENCOUNTER — Other Ambulatory Visit: Payer: Self-pay

## 2021-08-01 VITALS — BP 120/70 | HR 75 | Temp 97.6°F | Wt 189.1 lb

## 2021-08-01 DIAGNOSIS — F988 Other specified behavioral and emotional disorders with onset usually occurring in childhood and adolescence: Secondary | ICD-10-CM

## 2021-08-01 DIAGNOSIS — F5102 Adjustment insomnia: Secondary | ICD-10-CM

## 2021-08-01 MED ORDER — AMPHETAMINE-DEXTROAMPHET ER 20 MG PO CP24: 20.0000 mg | ORAL_CAPSULE | ORAL | 0 refills | Status: DC

## 2021-08-01 MED ORDER — LORAZEPAM 0.5 MG PO TABS: 0.5000 mg | ORAL_TABLET | Freq: Four times a day (QID) | ORAL | 1 refills | Status: DC | PRN

## 2021-08-01 NOTE — Progress Notes (Signed)
Established Patient Office Visit  Subjective:  Patient ID: Bob [name], male    DOB: 08-24-1996  Age: 25 y.o. MRN: [id number]  CC:  Chief Complaint  Patient presents with   Medication Refill    Refill adderall, discuss a Rx for insomnia    HPI Rolando Hessling Kirkes presents for medical follow-up.  He has attention deficit disorder.  He takes Adderall XR 20 mg daily and this seems to be working well.  He did recently just start a new job.  Has naturally had some stress related to that and has had some intermittent insomnia issues which he thinks is partly related to that.  Recently working 60+ hours per week during transition from old job new job.  He does have difficulty with sleep but seems to be improving some over the past week.  Anticipated upcoming international travel.  Requesting something low-dose for anxiety symptoms.  Does not take any other regular medications other than Adderall.  Past Medical History:  Diagnosis Date   ADD (attention deficit disorder)    Broken arm 2002   right   Depression     Past Surgical History:  Procedure Laterality Date   broken arm Right    pins at age 51   LAPAROSCOPIC APPENDECTOMY N/A 11/29/2020   Procedure: APPENDECTOMY LAPAROSCOPIC;  Surgeon: Emelia Loron, MD;  Location: [facility] OR;  Service: General;  Laterality: N/A;    Family History  Problem Relation Age of Onset   Cancer Paternal Aunt        colon   Heart disease Maternal Grandmother    Hypertension Maternal Grandfather    Hyperlipidemia Maternal Grandfather    Diabetes Maternal Grandfather    Alcohol abuse Paternal Grandmother    Cancer Paternal Grandmother        lung    Social History   Socioeconomic History   Marital status: Not on file    Spouse name: Not on file   Number of children: Not on file   Years of education: Not on file   Highest education level: Not on file  Occupational History   Not on file  Tobacco Use   Smoking status: Never    Smokeless tobacco: Never  Vaping Use   Vaping Use: Never used  Substance and Sexual Activity   Alcohol use: Yes    Alcohol/week: 6.0 standard drinks    Types: 6 Cans of beer per week   Drug use: No   Sexual activity: Yes    Birth control/protection: Pill  Other Topics Concern   Not on file  Social History Narrative   Not on file   Social Determinants of Health   Financial Resource Strain: Not on file  Food Insecurity: Not on file  Transportation Needs: Not on file  Physical Activity: Not on file  Stress: Not on file  Social Connections: Not on file  Intimate Partner Violence: Not on file    Outpatient Medications Prior to Visit  Medication Sig Dispense Refill   amphetamine-dextroamphetamine (ADDERALL XR) 20 MG 24 hr capsule Take 1 capsule (20 mg total) by mouth every morning. 30 capsule 0   fluorouracil (EFUDEX) 5 % cream Apply topically 2 (two) times daily. 40 g 1   amphetamine-dextroamphetamine (ADDERALL) 20 MG tablet Take 1 tablet (20 mg total) by mouth daily. 30 tablet 0   oxyCODONE (OXY IR/ROXICODONE) 5 MG immediate release tablet Take 1 tablet (5 mg total) by mouth every 6 (six) hours as needed for moderate pain, severe pain  or breakthrough pain. 10 tablet 0   No facility-administered medications prior to visit.    Allergies  Allergen Reactions   Codeine Rash   Sulfa Antibiotics Rash    ROS Review of Systems  Constitutional:  Negative for appetite change and unexpected weight change.  Cardiovascular:  Negative for chest pain.  Neurological:  Negative for headaches.     Objective:    Physical Exam Vitals reviewed.  Constitutional:      Appearance: Normal appearance.  Cardiovascular:     Rate and Rhythm: Normal rate and regular rhythm.     Heart sounds: No murmur heard. Pulmonary:     Effort: Pulmonary effort is normal.     Breath sounds: Normal breath sounds.  Musculoskeletal:     Cervical back: Neck supple.  Lymphadenopathy:     Cervical: No  cervical adenopathy.  Neurological:     General: No focal deficit present.     Mental Status: He is alert.  Psychiatric:        Mood and Affect: Mood normal.        Thought Content: Thought content normal.    BP 120/70 (BP Location: Left Arm, Patient Position: Sitting, Cuff Size: Normal)   Pulse 75   Temp 97.6 F (36.4 C) (Oral)   Wt 189 lb 1.6 oz (85.8 kg)   SpO2 96%   BMI 27.93 kg/m  Wt Readings from Last 3 Encounters:  08/01/21 189 lb 1.6 oz (85.8 kg)  11/29/20 170 lb (77.1 kg)  03/29/20 180 lb (81.6 kg)     Health Maintenance Due  Topic Date Due   HIV Screening  Never done   Hepatitis C Screening  Never done   TETANUS/TDAP  Never done   COVID-19 Vaccine (3 - Booster for Pfizer series) 08/20/2020   INFLUENZA VACCINE  05/28/2021    There are no preventive care reminders to display for this patient.  Lab Results  Component Value Date   TSH 3.66 04/27/2015   Lab Results  Component Value Date   WBC 15.4 (H) 11/28/2020   HGB 14.9 11/28/2020   HCT 42.9 11/28/2020   MCV 83.3 11/28/2020   PLT 302 11/28/2020   Lab Results  Component Value Date   NA 140 11/28/2020   K 3.6 11/28/2020   CO2 27 11/28/2020   GLUCOSE 119 (H) 11/28/2020   BUN 13 11/28/2020   CREATININE 1.05 11/28/2020   BILITOT 1.0 11/28/2020   ALKPHOS 51 11/28/2020   AST 32 11/28/2020   ALT 35 11/28/2020   PROT 7.0 11/28/2020   ALBUMIN 4.3 11/28/2020   CALCIUM 9.7 11/28/2020   ANIONGAP 13 11/28/2020   GFR 88.59 04/27/2015   Lab Results  Component Value Date   CHOL 118 04/27/2015   Lab Results  Component Value Date   HDL 33.60 (L) 04/27/2015   Lab Results  Component Value Date   LDLCALC 61 04/27/2015   Lab Results  Component Value Date   TRIG 116.0 04/27/2015   Lab Results  Component Value Date   CHOLHDL 4 04/27/2015   Lab Results  Component Value Date   HGBA1C 5.2 02/10/2013      Assessment & Plan:   #1 attention deficit disorder.  Stable on Adderall XR 20 mg once  daily -Refill for 3 months  #2 transient insomnia.  Sleep hygiene discussed with handout given.  #3 situational anxiety with flying.  We wrote for limited lorazepam 0.5 mg take 1 tablet 1 hour prior to flight  Meds ordered  this encounter  Medications   LORazepam (ATIVAN) 0.5 MG tablet    Sig: Take 1 tablet (0.5 mg total) by mouth every 6 (six) hours as needed for anxiety.    Dispense:  10 tablet    Refill:  1    Follow-up: No follow-ups on file.    Evelena Peat, MD

## 2021-10-30 ENCOUNTER — Encounter: Payer: Self-pay | Admitting: Family Medicine

## 2021-11-09 ENCOUNTER — Ambulatory Visit (INDEPENDENT_AMBULATORY_CARE_PROVIDER_SITE_OTHER): Payer: 59 | Admitting: Family Medicine

## 2021-11-09 VITALS — BP 120/70 | HR 80 | Temp 98.3°F | Wt 188.1 lb

## 2021-11-09 DIAGNOSIS — K921 Melena: Secondary | ICD-10-CM

## 2021-11-09 DIAGNOSIS — F988 Other specified behavioral and emotional disorders with onset usually occurring in childhood and adolescence: Secondary | ICD-10-CM | POA: Diagnosis not present

## 2021-11-09 DIAGNOSIS — F5102 Adjustment insomnia: Secondary | ICD-10-CM | POA: Diagnosis not present

## 2021-11-09 LAB — CBC WITH DIFFERENTIAL/PLATELET
Basophils Absolute: 0.1 10*3/uL (ref 0.0–0.1)
Basophils Relative: 0.9 % (ref 0.0–3.0)
Eosinophils Absolute: 0.5 10*3/uL (ref 0.0–0.7)
Eosinophils Relative: 8.5 % — ABNORMAL HIGH (ref 0.0–5.0)
HCT: 45.5 % (ref 39.0–52.0)
Hemoglobin: 15.2 g/dL (ref 13.0–17.0)
Lymphocytes Relative: 29.6 % (ref 12.0–46.0)
Lymphs Abs: 1.8 10*3/uL (ref 0.7–4.0)
MCHC: 33.4 g/dL (ref 30.0–36.0)
MCV: 84.3 fl (ref 78.0–100.0)
Monocytes Absolute: 0.5 10*3/uL (ref 0.1–1.0)
Monocytes Relative: 7.8 % (ref 3.0–12.0)
Neutro Abs: 3.3 10*3/uL (ref 1.4–7.7)
Neutrophils Relative %: 53.2 % (ref 43.0–77.0)
Platelets: 300 10*3/uL (ref 150.0–400.0)
RBC: 5.4 Mil/uL (ref 4.22–5.81)
RDW: 13.3 % (ref 11.5–15.5)
WBC: 6.2 10*3/uL (ref 4.0–10.5)

## 2021-11-09 LAB — HEMOCCULT GUIAC POC 1CARD (OFFICE): Fecal Occult Blood, POC: NEGATIVE

## 2021-11-09 LAB — C-REACTIVE PROTEIN: CRP: <1 mg/dL (ref 0.5–20.0)

## 2021-11-09 MED ORDER — HYDROCORTISONE ACETATE 25 MG RE SUPP: 25.0000 mg | Freq: Two times a day (BID) | RECTAL | 0 refills | Status: DC

## 2021-11-09 MED ORDER — LORAZEPAM 0.5 MG PO TABS: 0.5000 mg | ORAL_TABLET | Freq: Four times a day (QID) | ORAL | 0 refills | Status: DC | PRN

## 2021-11-09 MED ORDER — AMPHETAMINE-DEXTROAMPHETAMINE 10 MG PO TABS: 10.0000 mg | ORAL_TABLET | Freq: Two times a day (BID) | ORAL | 0 refills | Status: DC

## 2021-11-09 NOTE — Progress Notes (Signed)
Established Patient Office Visit  Subjective:  Patient ID: Bob [name], male    DOB: 10-16-1996  Age: 26 y.o. MRN: [id number]  CC:  Chief Complaint  Patient presents with   Blood In Stools    X 1 year off and on, no pain or discomfort, BM regular, feels like his bladder does not empty all the way sometimes    HPI Bob [name] presents for the following items  Bright red blood per rectum intermittently past year.  Mostly notices blood with wiping.  Only rare mild perianal pain.  No constipation.  No diarrhea.  No appetite or weight changes.  No abdominal cramping.  He has a grandmother with Crohn's disease.  No history of anemia.  Requests refill of lorazepam.  Uses infrequently with international travel.  He is also requesting change from Adderall extended release to immediate release.  When he is traveling internationally has had problems with insomnia and would like flexibility of 10 mg dosing to take half tablet if necessary to reduce insomnia issues  Past Medical History:  Diagnosis Date   ADD (attention deficit disorder)    Broken arm 2002   right   Depression     Past Surgical History:  Procedure Laterality Date   broken arm Right    pins at age 36   LAPAROSCOPIC APPENDECTOMY N/A 11/29/2020   Procedure: APPENDECTOMY LAPAROSCOPIC;  Surgeon: Rolm Bookbinder, MD;  Location: [place];  Service: General;  Laterality: N/A;    Family History  Problem Relation Age of Onset   Cancer Paternal Aunt        colon   Heart disease Maternal Grandmother    Hypertension Maternal Grandfather    Hyperlipidemia Maternal Grandfather    Diabetes Maternal Grandfather    Alcohol abuse Paternal Grandmother    Cancer Paternal Grandmother        lung    Social History   Socioeconomic History   Marital status: Not on file    Spouse name: Not on file   Number of children: Not on file   Years of education: Not on file   Highest education level: Not on file   Occupational History   Not on file  Tobacco Use   Smoking status: Never   Smokeless tobacco: Never  Vaping Use   Vaping Use: Never used  Substance and Sexual Activity   Alcohol use: Yes    Alcohol/week: 6.0 standard drinks    Types: 6 Cans of beer per week   Drug use: No   Sexual activity: Yes    Birth control/protection: Pill  Other Topics Concern   Not on file  Social History Narrative   Not on file   Social Determinants of Health   Financial Resource Strain: Not on file  Food Insecurity: Not on file  Transportation Needs: Not on file  Physical Activity: Not on file  Stress: Not on file  Social Connections: Not on file  Intimate Partner Violence: Not on file    Outpatient Medications Prior to Visit  Medication Sig Dispense Refill   amphetamine-dextroamphetamine (ADDERALL XR) 20 MG 24 hr capsule Take 1 capsule (20 mg total) by mouth every morning. 30 capsule 0   amphetamine-dextroamphetamine (ADDERALL XR) 20 MG 24 hr capsule Take 1 capsule (20 mg total) by mouth every morning. 30 capsule 0   amphetamine-dextroamphetamine (ADDERALL XR) 20 MG 24 hr capsule Take 1 capsule (20 mg total) by mouth every morning. 30 capsule 0   fluorouracil (EFUDEX) 5 %  cream Apply topically 2 (two) times daily. 40 g 1   LORazepam (ATIVAN) 0.5 MG tablet Take 1 tablet (0.5 mg total) by mouth every 6 (six) hours as needed for anxiety. 10 tablet 1   No facility-administered medications prior to visit.    Allergies  Allergen Reactions   Codeine Rash   Sulfa Antibiotics Rash    ROS Review of Systems  Constitutional:  Negative for appetite change and unexpected weight change.  Gastrointestinal:  Positive for anal bleeding and blood in stool. Negative for abdominal distention, abdominal pain, constipation, diarrhea, nausea and vomiting.     Objective:    Physical Exam Vitals reviewed.  Constitutional:      Appearance: Normal appearance.  Abdominal:     Palpations: Abdomen is soft.      Tenderness: There is no abdominal tenderness.  Genitourinary:    Comments: No external hemorrhoids.  No obvious anal fissure.  Digital exam reveals no mass.  Hemoccult negative.  Anoscopy performed.  He does have some mild internal hemorrhoids but no active bleeding.  No other abnormalities noted. Musculoskeletal:     Right lower leg: No edema.     Left lower leg: No edema.  Neurological:     Mental Status: He is alert.    BP 120/70 (BP Location: Left Arm, Patient Position: Sitting, Cuff Size: Normal)    Pulse 80    Temp 98.3 F (36.8 C) (Oral)    Wt 188 lb 1.6 oz (85.3 kg)    SpO2 98%    BMI 27.78 kg/m  Wt Readings from Last 3 Encounters:  11/09/21 188 lb 1.6 oz (85.3 kg)  08/01/21 189 lb 1.6 oz (85.8 kg)  11/29/20 170 lb (77.1 kg)     Health Maintenance Due  Topic Date Due   Pneumococcal Vaccine 32-71 Years old (1 - PCV) Never done   HIV Screening  Never done   Hepatitis C Screening  Never done   TETANUS/TDAP  Never done   COVID-19 Vaccine (3 - Booster for Pfizer series) 05/15/2020   INFLUENZA VACCINE  05/28/2021    There are no preventive care reminders to display for this patient.  Lab Results  Component Value Date   TSH 3.66 04/27/2015   Lab Results  Component Value Date   WBC 15.4 (H) 11/28/2020   HGB 14.9 11/28/2020   HCT 42.9 11/28/2020   MCV 83.3 11/28/2020   PLT 302 11/28/2020   Lab Results  Component Value Date   NA 140 11/28/2020   K 3.6 11/28/2020   CO2 27 11/28/2020   GLUCOSE 119 (H) 11/28/2020   BUN 13 11/28/2020   CREATININE 1.05 11/28/2020   BILITOT 1.0 11/28/2020   ALKPHOS 51 11/28/2020   AST 32 11/28/2020   ALT 35 11/28/2020   PROT 7.0 11/28/2020   ALBUMIN 4.3 11/28/2020   CALCIUM 9.7 11/28/2020   ANIONGAP 13 11/28/2020   GFR 88.59 04/27/2015   Lab Results  Component Value Date   CHOL 118 04/27/2015   Lab Results  Component Value Date   HDL 33.60 (L) 04/27/2015   Lab Results  Component Value Date   LDLCALC 61 04/27/2015    Lab Results  Component Value Date   TRIG 116.0 04/27/2015   Lab Results  Component Value Date   CHOLHDL 4 04/27/2015   Lab Results  Component Value Date   HGBA1C 5.2 02/10/2013      Assessment & Plan:   #1 hematochezia.  Suspect benign source.  He does have  some internal hemorrhoids though not actively bleeding.  Suspect this is most likely cause.  He does not have any red flags such as appetite change, weight loss, diarrhea, etc. -Check CBC and C-reactive protein -Consider trial of Anusol HC 25 mg suppositories twice daily -Continue high-fiber diet and measures to reduce constipation -If he does the above and has recurrent bleeding within the next month be in touch  Anoscopy was performed.  We discussed the risk including low risk of bleeding and associated pain.  Patient consented.  After performing digital exam with started the anoscope to the full extent without difficulty.  We then remove trocar and examined rectal mucosa carefully with good light source.  Inflamed internal hemorrhoids were noted but no active bleeding.  No other abnormalities noted.  Patient tolerated well.  #2 history of ADD.  Patient requesting change from Adderall extended release to Adderall 10 mg immediate release.  We will send a new prescription to take twice daily as needed  #3 transient insomnia.  Mostly associated with international travel.  Send in 1 refill of lorazepam to use as needed   Meds ordered this encounter  Medications   hydrocortisone (ANUSOL-HC) 25 MG suppository    Sig: Place 1 suppository (25 mg total) rectally 2 (two) times daily.    Dispense:  20 suppository    Refill:  0    Follow-up: No follow-ups on file.    Carolann Littler, MD

## 2022-03-08 ENCOUNTER — Encounter: Payer: Self-pay | Admitting: Family Medicine

## 2022-03-11 MED ORDER — AMPHETAMINE-DEXTROAMPHETAMINE 10 MG PO TABS: 10.0000 mg | ORAL_TABLET | Freq: Two times a day (BID) | ORAL | 0 refills | Status: DC

## 2022-03-11 MED ORDER — LORAZEPAM 0.5 MG PO TABS: 0.5000 mg | ORAL_TABLET | Freq: Four times a day (QID) | ORAL | 0 refills | Status: DC | PRN

## 2022-03-11 NOTE — Telephone Encounter (Signed)
Prescriptions sent

## 2022-04-11 IMAGING — CT CT ABD-PELV W/ CM
2 of 4 series · 16 of 46 positions shown, 18 images · IV contrast (omnipaque)
Comparison: 04/09/2010

CLINICAL DATA: Right lower quadrant abdominal pain since last night

EXAM:
CT ABDOMEN AND PELVIS WITH CONTRAST
TECHNIQUE: Multidetector CT imaging of the abdomen and pelvis was performed
using the standard protocol following bolus administration of
intravenous contrast.
CONTRAST:  100mL OMNIPAQUE IOHEXOL 300 MG/ML  SOLN

[Series 3: abdomen 5.0 · axial · 0.69mm/px · z∈[+762,+1162]mm · 13 of 92 slices shown, 15 images]
[im 6/92  soft-tissue]
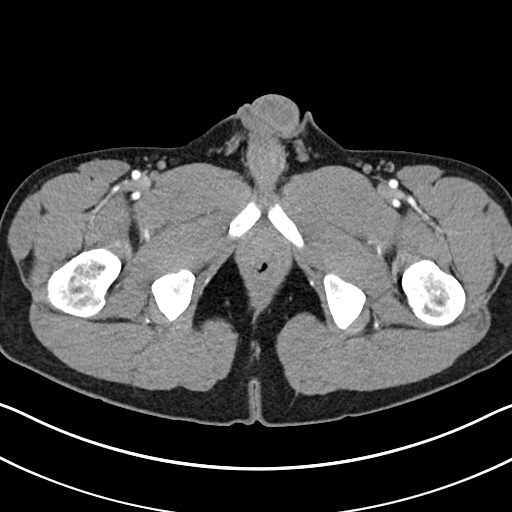
[im 6/92  bone]
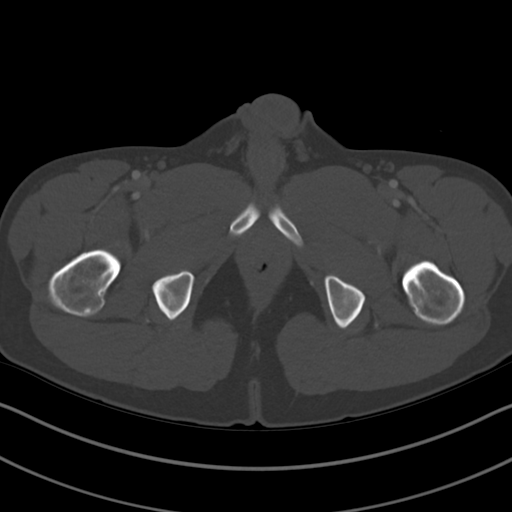
[im 11/92  soft-tissue]
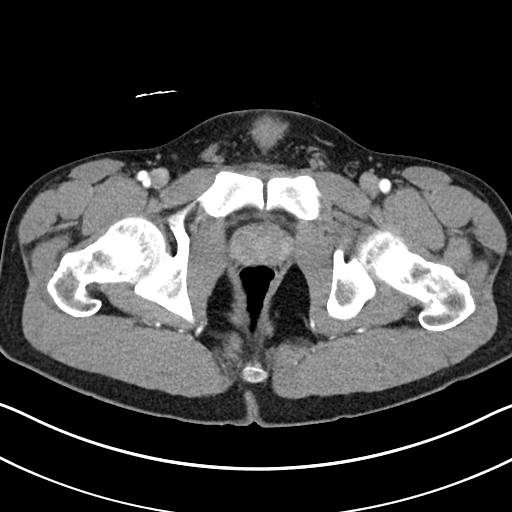
[im 21/92  soft-tissue]
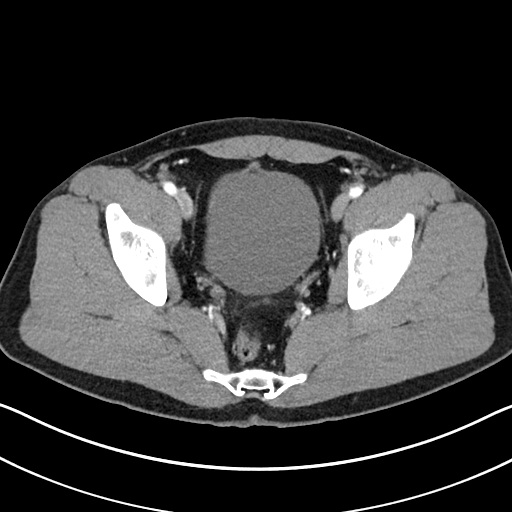
[im 26/92  soft-tissue]
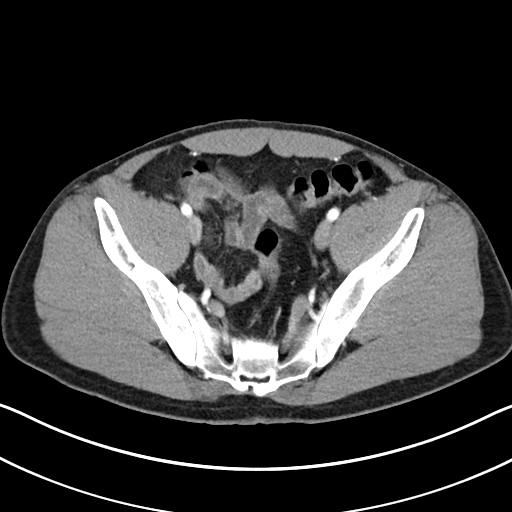
[im 31/92  soft-tissue]
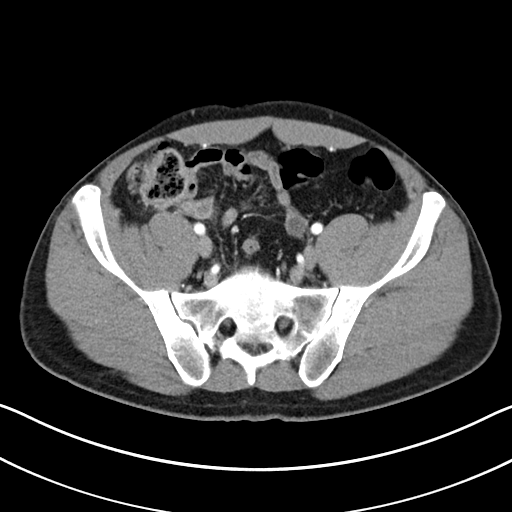
[im 41/92  soft-tissue]
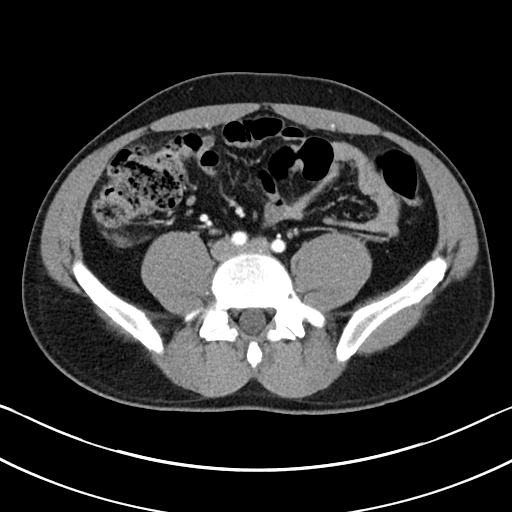
[im 46/92  soft-tissue]
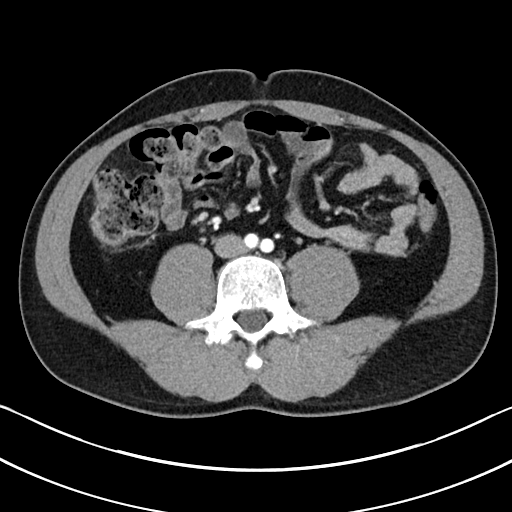
[im 51/92  soft-tissue]
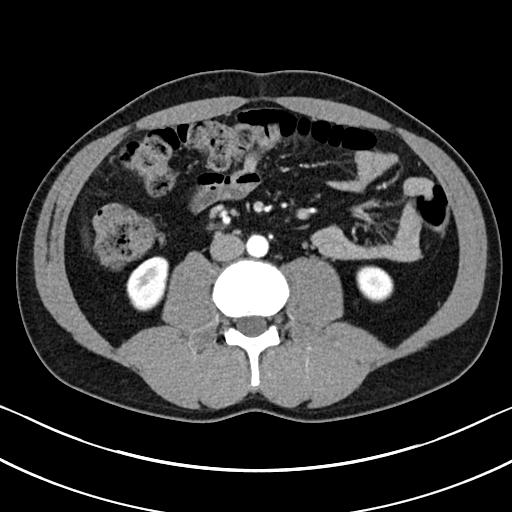
[im 61/92  soft-tissue]
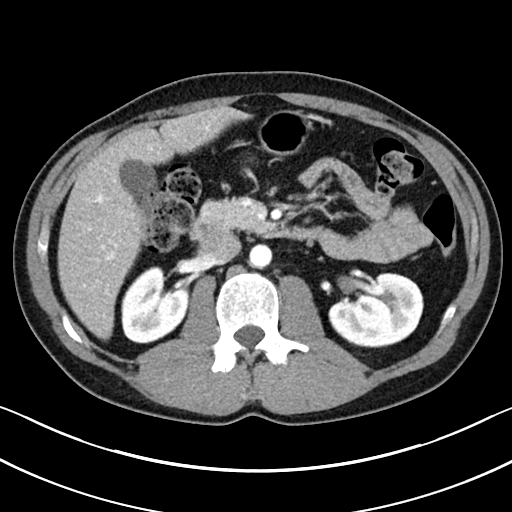
[im 61/92  bone]
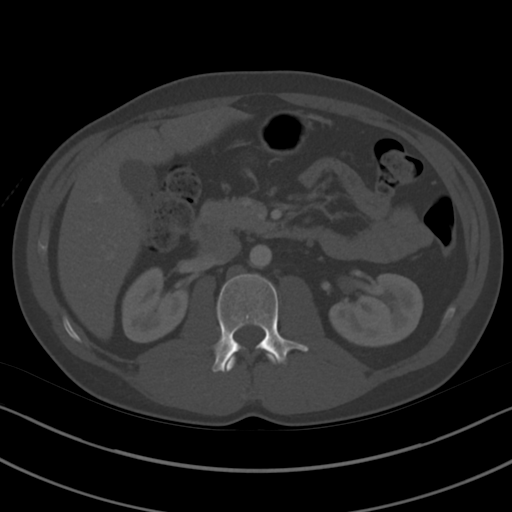
[im 66/92  soft-tissue]
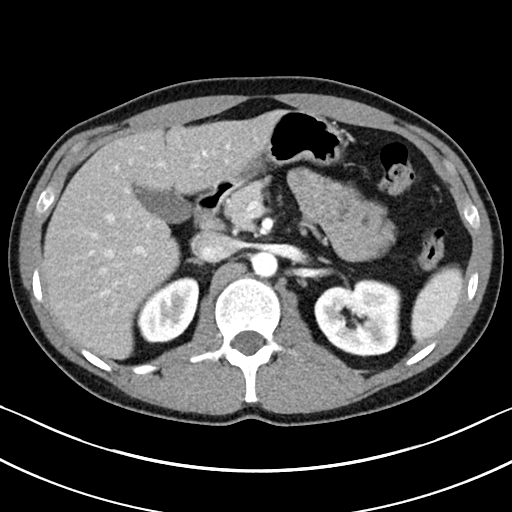
[im 71/92  soft-tissue]
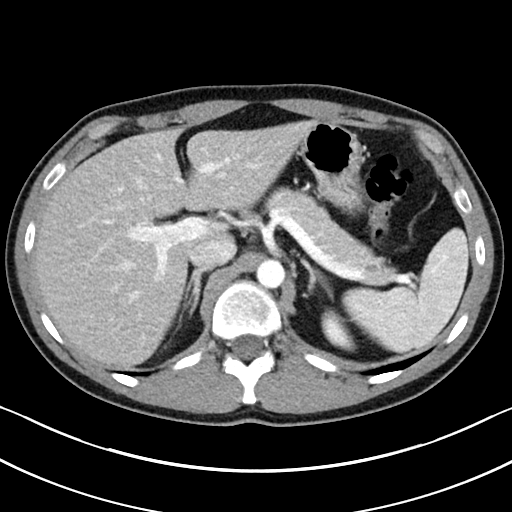
[im 81/92  soft-tissue]
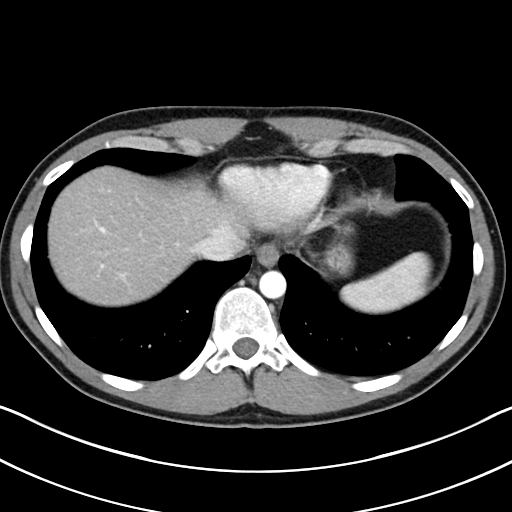
[im 86/92  soft-tissue]
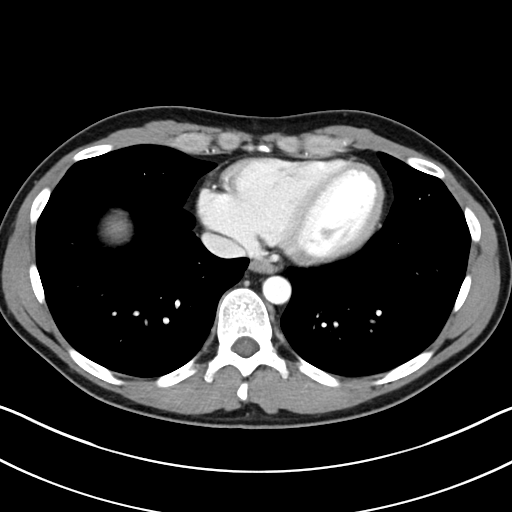

[Series 6: abdomen 3.0 mpr cor · coronal · 0.72mm/px · 3 of 101 slices shown]
[im 34/101  soft-tissue]
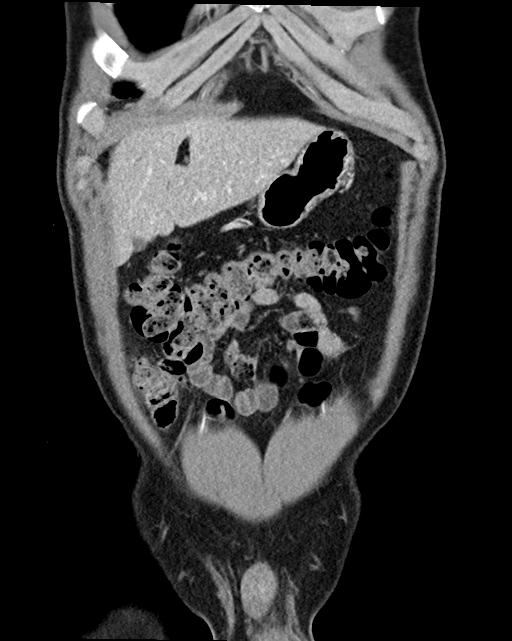
[im 45/101  soft-tissue]
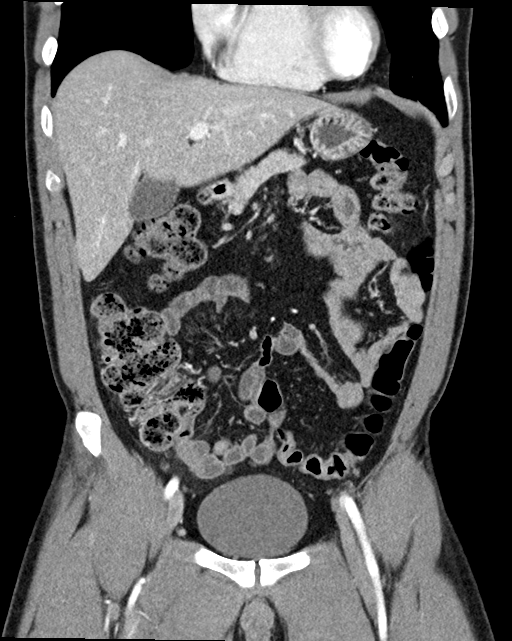
[im 56/101  soft-tissue]
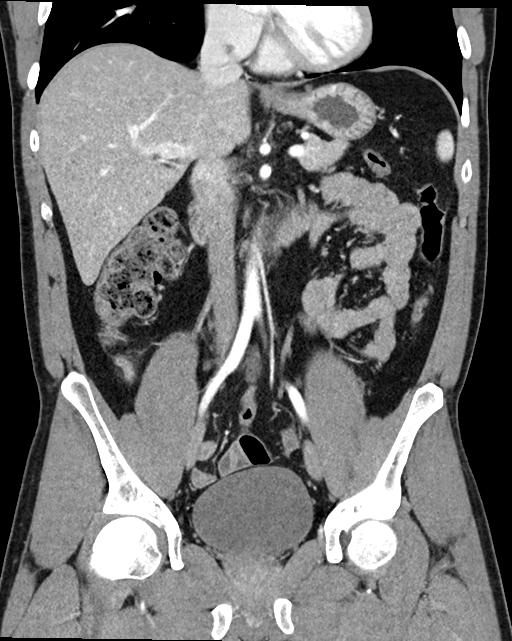

[16 of 46 positions shown; findings below may reference images not displayed]

FINDINGS: Lower chest:  No contributory findings.

Hepatobiliary: Hepatic steatosis.No evidence of biliary obstruction
or stone.

Pancreas: Unremarkable.

Spleen: Unremarkable.

Adrenals/Urinary Tract: Negative adrenals. No hydronephrosis or
stone. Unremarkable bladder.

Stomach/Bowel: No obstruction. Mild fat stranding around the
appendix which shows prominent wall thickness/enhancement. Outer
wall diameter is only up to 8 mm. No stone, abscess, or perforation.
The appendix is in the right lower quadrant and retrocecal.

Vascular/Lymphatic: No acute vascular abnormality. No mass or
adenopathy.

Reproductive:No pathologic findings.

Other: No ascites or pneumoperitoneum.

Musculoskeletal: No acute abnormalities.
IMPRESSION: In the appropriate clinical setting, there are findings of early
acute appendicitis. No appendicolith.

## 2022-08-22 ENCOUNTER — Other Ambulatory Visit: Payer: Self-pay | Admitting: Family Medicine

## 2022-08-23 MED ORDER — AMPHETAMINE-DEXTROAMPHETAMINE 10 MG PO TABS: 10.0000 mg | ORAL_TABLET | Freq: Two times a day (BID) | ORAL | 0 refills | Status: DC

## 2022-08-23 MED ORDER — LORAZEPAM 0.5 MG PO TABS: 0.5000 mg | ORAL_TABLET | Freq: Four times a day (QID) | ORAL | 0 refills | Status: DC | PRN

## 2022-08-23 NOTE — Telephone Encounter (Signed)
Last OV-11/09/21 Last refill Adderall-03/11/22 Last refill Ativan-03/11/22  No future OV scheduled.

## 2022-11-05 ENCOUNTER — Encounter: Payer: Self-pay | Admitting: Family Medicine

## 2022-11-05 ENCOUNTER — Other Ambulatory Visit: Payer: Self-pay | Admitting: Family Medicine

## 2022-11-05 MED ORDER — LORAZEPAM 0.5 MG PO TABS: 0.5000 mg | ORAL_TABLET | Freq: Four times a day (QID) | ORAL | 0 refills | Status: DC | PRN

## 2022-11-05 MED ORDER — AMPHETAMINE-DEXTROAMPHET ER 20 MG PO CP24: 20.0000 mg | ORAL_CAPSULE | ORAL | 0 refills | Status: DC

## 2022-11-05 NOTE — Telephone Encounter (Signed)
Sending back

## 2022-11-05 NOTE — Telephone Encounter (Signed)
Refilled once.   Avoid regular use.  Eulas Post MD [facility] at [facility]

## 2022-11-05 NOTE — Telephone Encounter (Signed)
I sent in the Adderall XR 20 mg once daily.  Give feedback in a month if he is able to get this filled and working well and we can send in 10-month refills  Eulas Post MD [facility] at [facility]

## 2023-02-10 ENCOUNTER — Other Ambulatory Visit: Payer: Self-pay | Admitting: Family Medicine

## 2023-02-10 MED ORDER — LORAZEPAM 0.5 MG PO TABS: 0.5000 mg | ORAL_TABLET | Freq: Four times a day (QID) | ORAL | 0 refills | Status: DC | PRN

## 2023-02-10 MED ORDER — AMPHETAMINE-DEXTROAMPHET ER 20 MG PO CP24: 20.0000 mg | ORAL_CAPSULE | ORAL | 0 refills | Status: DC

## 2023-04-29 ENCOUNTER — Encounter: Payer: Self-pay | Admitting: Family Medicine

## 2023-04-29 ENCOUNTER — Telehealth (INDEPENDENT_AMBULATORY_CARE_PROVIDER_SITE_OTHER): Payer: 59 | Admitting: Family Medicine

## 2023-04-29 VITALS — BP 118/79 | Ht 69.0 in | Wt 188.1 lb

## 2023-04-29 DIAGNOSIS — G4700 Insomnia, unspecified: Secondary | ICD-10-CM | POA: Diagnosis not present

## 2023-04-29 DIAGNOSIS — F988 Other specified behavioral and emotional disorders with onset usually occurring in childhood and adolescence: Secondary | ICD-10-CM | POA: Diagnosis not present

## 2023-04-29 MED ORDER — LORAZEPAM 0.5 MG PO TABS: 0.5000 mg | ORAL_TABLET | Freq: Four times a day (QID) | ORAL | 0 refills | Status: DC | PRN

## 2023-04-29 MED ORDER — AMPHETAMINE-DEXTROAMPHET ER 20 MG PO CP24
20.0000 mg | ORAL_CAPSULE | ORAL | 0 refills | Status: DC
Start: 2023-04-29 — End: 2023-08-21

## 2023-04-29 NOTE — Progress Notes (Signed)
Patient ID: Bob [name], male   DOB: 1996-02-22, 27 y.o.   MRN: [id number]   Virtual Visit via Video Note  I connected with Novella Olive on 04/29/23 at  3:45 PM EDT by a video enabled telemedicine application and verified that I am speaking with the correct person using two identifiers.  Location patient: home Location provider:work or home office Persons participating in the virtual visit: patient, provider  I discussed the limitations of evaluation and management by telemedicine and the availability of in person appointments. The patient expressed understanding and agreed to proceed.   HPI: Tremir has history of ADD and takes Adderall XR 20 mg once daily.  This is working well for him.  He denies any side effects from medication.  Appetite and weight stable.  Blood pressures are stable and usually range around 115 systolic and 65 diastolic.  No recent headaches.  He has history of intermittent insomnia.  He has used low-dose lorazepam in the past for things like travel with flying to [place] and also for time changes when he flies to other time zones.  Uses this very infrequently.  Requesting refill.   ROS: See pertinent positives and negatives per HPI.  Past Medical History:  Diagnosis Date   ADD (attention deficit disorder)    Broken arm 2002   right   Depression     Past Surgical History:  Procedure Laterality Date   broken arm Right    pins at age 23   LAPAROSCOPIC APPENDECTOMY N/A 11/29/2020   Procedure: APPENDECTOMY LAPAROSCOPIC;  Surgeon: Emelia Loron, MD;  Location: [facility] OR;  Service: General;  Laterality: N/A;    Family History  Problem Relation Age of Onset   Cancer Paternal Aunt        colon   Heart disease Maternal Grandmother    Hypertension Maternal Grandfather    Hyperlipidemia Maternal Grandfather    Diabetes Maternal Grandfather    Alcohol abuse Paternal Grandmother    Cancer Paternal Grandmother        lung    SOCIAL HX:  Non-smoker   Current Outpatient Medications:    fluorouracil (EFUDEX) 5 % cream, Apply topically 2 (two) times daily., Disp: 40 g, Rfl: 1   hydrocortisone (ANUSOL-HC) 25 MG suppository, Place 1 suppository (25 mg total) rectally 2 (two) times daily., Disp: 20 suppository, Rfl: 0   amphetamine-dextroamphetamine (ADDERALL XR) 20 MG 24 hr capsule, Take 1 capsule (20 mg total) by mouth every morning., Disp: 30 capsule, Rfl: 0   LORazepam (ATIVAN) 0.5 MG tablet, Take 1 tablet (0.5 mg total) by mouth every 6 (six) hours as needed for anxiety., Disp: 30 tablet, Rfl: 0  EXAM:  VITALS per patient if applicable:  GENERAL: alert, oriented, appears well and in no acute distress  HEENT: atraumatic, conjunttiva clear, no obvious abnormalities on inspection of external nose and ears  NECK: normal movements of the head and neck  LUNGS: on inspection no signs of respiratory distress, breathing rate appears normal, no obvious gross SOB, gasping or wheezing  CV: no obvious cyanosis  MS: moves all visible extremities without noticeable abnormality  PSYCH/NEURO: pleasant and cooperative, no obvious depression or anxiety, speech and thought processing grossly intact  ASSESSMENT AND PLAN:  Discussed the following assessment and plan:  #1 ADD.  Stable.  Refill Adderall XR 20 mg once daily.  Continue to monitor blood pressure.  Recommend office follow-up for next visit  #2 intermittent insomnia and anxiety.  Patient infrequently takes lorazepam for things like  travel and for severe sleep difficulties with change of time zones.  We gave refill of #30 to use 1 nightly as needed      I discussed the assessment and treatment plan with the patient. The patient was provided an opportunity to ask questions and all were answered. The patient agreed with the plan and demonstrated an understanding of the instructions.   The patient was advised to call back or seek an in-person evaluation if the symptoms worsen  or if the condition fails to improve as anticipated.     Evelena Peat, MD

## 2023-06-10 ENCOUNTER — Other Ambulatory Visit: Payer: Self-pay

## 2023-06-10 ENCOUNTER — Emergency Department (HOSPITAL_BASED_OUTPATIENT_CLINIC_OR_DEPARTMENT_OTHER): Payer: 59 | Admitting: Radiology

## 2023-06-10 ENCOUNTER — Emergency Department (HOSPITAL_BASED_OUTPATIENT_CLINIC_OR_DEPARTMENT_OTHER)
Admission: EM | Admit: 2023-06-10 | Discharge: 2023-06-10 | Disposition: A | Discharge status: Home / Self Care | Payer: 59 | Attending: Emergency Medicine | Admitting: Emergency Medicine

## 2023-06-10 DIAGNOSIS — T18128A Food in esophagus causing other injury, initial encounter: Secondary | ICD-10-CM | POA: Diagnosis present

## 2023-06-10 DIAGNOSIS — W44F3XA Food entering into or through a natural orifice, initial encounter: Secondary | ICD-10-CM | POA: Insufficient documentation

## 2023-06-10 MED ORDER — GLUCAGON HCL RDNA (DIAGNOSTIC) 1 MG IJ SOLR
1.0000 mg | Freq: Once | INTRAMUSCULAR | Status: AC
  Administered 2023-06-10: 1 mg via INTRAVENOUS
  Filled 2023-06-10: qty 1

## 2023-06-10 NOTE — ED Provider Notes (Signed)
[place] EMERGENCY DEPARTMENT AT [facility] Provider Note   CSN: [id number] Arrival date & time: 06/10/23  2028     History  Chief Complaint  Patient presents with   Foreign Body    Blythe Clough Nash is a 27 y.o. male.  Patient is a 27 year old male with no significant history who is presenting today after choking on a piece of chicken and feeling like it still stuck in his throat.  He reports he has not been able to tolerate any saliva and he keeps regurgitating.  He is unable to drink any water.  He denies any shortness of breath.  He does report that this has happened in the past but never this extreme.  The history is provided by the patient.  Foreign Body      Home Medications Prior to Admission medications   Medication Sig Start Date End Date Taking? Authorizing Provider  amphetamine-dextroamphetamine (ADDERALL XR) 20 MG 24 hr capsule Take 1 capsule (20 mg total) by mouth every morning. 04/29/23   Burchette, Elberta Fortis, MD  fluorouracil (EFUDEX) 5 % cream Apply topically 2 (two) times daily. 01/22/21   Felecia Shelling, DPM  hydrocortisone (ANUSOL-HC) 25 MG suppository Place 1 suppository (25 mg total) rectally 2 (two) times daily. 11/09/21   Burchette, Elberta Fortis, MD  LORazepam (ATIVAN) 0.5 MG tablet Take 1 tablet (0.5 mg total) by mouth every 6 (six) hours as needed for anxiety. 04/29/23   Burchette, Elberta Fortis, MD      Allergies    Codeine and Sulfa antibiotics    Review of Systems   Review of Systems  Physical Exam Updated Vital Signs BP (!) 137/95   Pulse 67   Temp 98.5 F (36.9 C) (Oral)   Resp 18   SpO2 99%  Physical Exam Vitals and nursing note reviewed.  Constitutional:      General: He is not in acute distress.    Appearance: He is well-developed.  HENT:     Head: Normocephalic and atraumatic.  Eyes:     Conjunctiva/sclera: Conjunctivae normal.     Pupils: Pupils are equal, round, and reactive to light.  Cardiovascular:     Rate and Rhythm:  Normal rate and regular rhythm.     Heart sounds: No murmur heard. Pulmonary:     Effort: Pulmonary effort is normal. No respiratory distress.     Breath sounds: Normal breath sounds. No wheezing or rales.  Musculoskeletal:        General: No tenderness. Normal range of motion.     Cervical back: Normal range of motion and neck supple.  Skin:    General: Skin is warm and dry.     Findings: No erythema or rash.  Neurological:     Mental Status: He is alert and oriented to person, place, and time.  Psychiatric:        Behavior: Behavior normal.     ED Results / Procedures / Treatments   Labs (all labs ordered are listed, but only abnormal results are displayed) Labs Reviewed - No data to display  EKG None  Radiology DG Neck Soft Tissue  Result Date: 06/10/2023 CLINICAL DATA:  Possible piece of chicken stuck. Patient choked on a piece of chicken breast and still fills in the throat. Unable to swallow. EXAM: NECK SOFT TISSUES - 1+ VIEW COMPARISON:  None Available. FINDINGS: No radiopaque soft tissue foreign bodies are demonstrated. Air-filled hypopharynx and cervical trachea with moderately prominent soft tissue density demonstrated at the  level of the glottis, possibly representing retained non radiopaque foreign body. The epiglottis and prevertebral soft tissues are normal. Visualized lung apices are clear. IMPRESSION: Prominence of soft tissue density at the level of the glottis may represent retained non radiopaque foreign body. Electronically Signed   By: Burman Nieves M.D.   On: 06/10/2023 21:02    Procedures Procedures    Medications Ordered in ED Medications  glucagon (human recombinant) (GLUCAGEN) injection 1 mg (1 mg Intravenous Given 06/10/23 2110)    ED Course/ Medical Decision Making/ A&P                                 Medical Decision Making Amount and/or Complexity of Data Reviewed Radiology: ordered.   Pt  presenting today with a complaint that caries a  high risk for morbidity and mortality.  Here today with symptoms suggestive of a food bolus impaction.  Upon arrival patient has no airway issues but is regurgitating.  Patient received glucagon and within 15 minutes and vomited up the piece of chicken.  He now has no complaints he is able to drink water and only states there is some minimal sore throat.  Patient given GI follow-up if he starts having recurrent issues with swallowing.  At this time patient is stable for discharge.         Final Clinical Impression(s) / ED Diagnoses Final diagnoses:  Food impaction of esophagus, initial encounter    Rx / DC Orders ED Discharge Orders     None         Gwyneth Sprout, MD 06/10/23 2305

## 2023-06-10 NOTE — ED Triage Notes (Signed)
Pt presents to the after choking on a piece of chicken breast. Pt stated he can still feel it in his throat and he can't swallow anything.

## 2023-06-10 NOTE — Discharge Instructions (Signed)
Make sure you take small bites of meats and breads but if you continue to have issues with swallowing and feeling like food gets stuck you need to follow-up with gastroenterology and they will need to do an endoscopy.

## 2023-06-10 NOTE — ED Notes (Signed)
Reviewed AVS with patient, patient expressed understanding of directions, denies further questions at this time. 

## 2023-06-10 NOTE — ED Notes (Signed)
Patient vomited, states he no longer feels food bolus in throat, able to swallow water without difficulty, endorses slight sore throat but no other complaints. Patient resting quietly in stretcher, respirations even, unlabored, no acute distress noted. Denies needs at this time.

## 2023-08-21 ENCOUNTER — Other Ambulatory Visit: Payer: Self-pay | Admitting: Family Medicine

## 2023-08-22 MED ORDER — LORAZEPAM 0.5 MG PO TABS: 0.5000 mg | ORAL_TABLET | Freq: Four times a day (QID) | ORAL | 0 refills | Status: DC | PRN

## 2023-08-22 MED ORDER — AMPHETAMINE-DEXTROAMPHET ER 20 MG PO CP24: 20.0000 mg | ORAL_CAPSULE | ORAL | 0 refills | Status: DC

## 2023-10-26 ENCOUNTER — Other Ambulatory Visit: Payer: Self-pay | Admitting: Family Medicine

## 2023-10-27 MED ORDER — AMPHETAMINE-DEXTROAMPHET ER 20 MG PO CP24: 20.0000 mg | ORAL_CAPSULE | ORAL | 0 refills | Status: DC

## 2023-10-27 MED ORDER — LORAZEPAM 0.5 MG PO TABS: 0.5000 mg | ORAL_TABLET | Freq: Four times a day (QID) | ORAL | 0 refills | Status: DC | PRN

## 2024-01-29 ENCOUNTER — Encounter: Payer: Self-pay | Admitting: Family Medicine

## 2024-01-29 ENCOUNTER — Other Ambulatory Visit: Payer: Self-pay | Admitting: Family Medicine

## 2024-01-29 DIAGNOSIS — R1310 Dysphagia, unspecified: Secondary | ICD-10-CM

## 2024-01-30 MED ORDER — AMPHETAMINE-DEXTROAMPHET ER 20 MG PO CP24: 20.0000 mg | ORAL_CAPSULE | ORAL | 0 refills | Status: DC

## 2024-01-30 MED ORDER — LORAZEPAM 0.5 MG PO TABS: 0.5000 mg | ORAL_TABLET | Freq: Four times a day (QID) | ORAL | 0 refills | Status: DC | PRN

## 2024-02-24 ENCOUNTER — Encounter: Payer: Self-pay | Admitting: Physician Assistant

## 2024-03-02 ENCOUNTER — Encounter: Payer: Self-pay | Admitting: Physician Assistant

## 2024-03-02 ENCOUNTER — Ambulatory Visit (INDEPENDENT_AMBULATORY_CARE_PROVIDER_SITE_OTHER): Admitting: Physician Assistant

## 2024-03-02 ENCOUNTER — Other Ambulatory Visit (INDEPENDENT_AMBULATORY_CARE_PROVIDER_SITE_OTHER)

## 2024-03-02 VITALS — BP 130/88 | HR 93 | Ht 69.0 in | Wt 197.0 lb

## 2024-03-02 DIAGNOSIS — R1310 Dysphagia, unspecified: Secondary | ICD-10-CM

## 2024-03-02 DIAGNOSIS — R1319 Other dysphagia: Secondary | ICD-10-CM | POA: Diagnosis not present

## 2024-03-02 DIAGNOSIS — K625 Hemorrhage of anus and rectum: Secondary | ICD-10-CM | POA: Diagnosis not present

## 2024-03-02 DIAGNOSIS — K760 Fatty (change of) liver, not elsewhere classified: Secondary | ICD-10-CM | POA: Diagnosis not present

## 2024-03-02 DIAGNOSIS — K219 Gastro-esophageal reflux disease without esophagitis: Secondary | ICD-10-CM | POA: Diagnosis not present

## 2024-03-02 LAB — CBC WITH DIFFERENTIAL/PLATELET
Basophils Absolute: 0.1 10*3/uL (ref 0.0–0.1)
Basophils Relative: 0.7 % (ref 0.0–3.0)
Eosinophils Absolute: 0.2 10*3/uL (ref 0.0–0.7)
Eosinophils Relative: 2.1 % (ref 0.0–5.0)
HCT: 45.9 % (ref 39.0–52.0)
Hemoglobin: 15.6 g/dL (ref 13.0–17.0)
Lymphocytes Relative: 28.1 % (ref 12.0–46.0)
Lymphs Abs: 2.1 10*3/uL (ref 0.7–4.0)
MCHC: 34.1 g/dL (ref 30.0–36.0)
MCV: 83.3 fl (ref 78.0–100.0)
Monocytes Absolute: 0.5 10*3/uL (ref 0.1–1.0)
Monocytes Relative: 6.7 % (ref 3.0–12.0)
Neutro Abs: 4.7 10*3/uL (ref 1.4–7.7)
Neutrophils Relative %: 62.4 % (ref 43.0–77.0)
Platelets: 308 10*3/uL (ref 150.0–400.0)
RBC: 5.51 Mil/uL (ref 4.22–5.81)
RDW: 13.6 % (ref 11.5–15.5)
WBC: 7.6 10*3/uL (ref 4.0–10.5)

## 2024-03-02 LAB — BASIC METABOLIC PANEL WITH GFR
BUN: 19 mg/dL (ref 6–23)
CO2: 27 meq/L (ref 19–32)
Calcium: 9.7 mg/dL (ref 8.4–10.5)
Chloride: 101 meq/L (ref 96–112)
Creatinine, Ser: 1.07 mg/dL (ref 0.40–1.50)
GFR: 94.69 mL/min (ref >60.00)
Glucose, Bld: 86 mg/dL (ref 70–99)
Potassium: 4.1 meq/L (ref 3.5–5.1)
Sodium: 137 meq/L (ref 135–145)

## 2024-03-02 LAB — HEPATIC FUNCTION PANEL
ALT: 36 U/L (ref 0–53)
AST: 25 U/L (ref 0–37)
Albumin: 5 g/dL (ref 3.5–5.2)
Alkaline Phosphatase: 53 U/L (ref 39–117)
Bilirubin, Direct: 0.1 mg/dL (ref 0.0–0.3)
Total Bilirubin: 0.7 mg/dL (ref 0.2–1.2)
Total Protein: 7.9 g/dL (ref 6.0–8.3)

## 2024-03-02 MED ORDER — PANTOPRAZOLE SODIUM 20 MG PO TBEC: 20.0000 mg | DELAYED_RELEASE_TABLET | Freq: Every day | ORAL | 1 refills | Status: DC

## 2024-03-02 NOTE — Patient Instructions (Addendum)
 You have been scheduled for an endoscopy. Please follow written instructions given to you at your visit today.  If you use inhalers (even only as needed), please bring them with you on the day of your procedure. __________________________________________________________________________  Your provider has requested that you go to the basement level for lab work before leaving today. Press "B" on the elevator. The lab is located at the first door on the left as you exit the elevator.   Dysphagia precautions:  1. Take reflux medications 30+ minutes before food in the morning pantoprazole 2. Begin meals with warm beverage 3. Eat smaller more frequent meals 4. Eat slowly, taking small bites and sips 5. Alternate solids and liquids 6. Avoid foods/liquids that increase acid production 7. Sit upright during and for 30+ minutes after meals to facilitate esophageal clearing 8. All meats should be chopped finely.   If something gets hung in your esophagus and will not come up or go down, proceed to the emergency room.    Eosinophilic esophagitis is part of what we want to rule out.   Metabolic dysfunction associated seatohepatitis  Now the leading cause of liver failure in the [place] .  It is normally from such risk factors as obesity, diabetes, insulin resistance, high cholesterol, or metabolic syndrome.  The only definitive therapy is weight loss and exercise.   Suggest walking 20-30 mins daily.  Decreasing carbohydrates, increasing veggies.    Fatty Liver Fatty liver is the accumulation of fat in liver cells. It is also called hepatosteatosis or steatohepatitis. It is normal for your liver to contain some fat. If fat is more than 5 to 10% of your liver's weight, you have fatty liver.  There are often no symptoms (problems) for years while damage is still occurring. People often learn about their fatty liver when they have medical tests for other reasons. Fat can damage your liver for  years or even decades without causing problems. When it becomes severe, it can cause fatigue, weight loss, weakness, and confusion. This makes you more likely to develop more serious liver problems. The liver is the largest organ in the body. It does a lot of work and often gives no warning signs when it is sick until late in a disease. The liver has many important jobs including: Breaking down foods. Storing vitamins, iron, and other minerals. Making proteins. Making bile for food digestion. Breaking down many products including medications, alcohol and some poisons.  PROGNOSIS  Fatty liver may cause no damage or it can lead to an inflammation of the liver. This is, called steatohepatitis.  Over time the liver may become scarred and hardened. This condition is called cirrhosis. Cirrhosis is serious and may lead to liver failure or cancer. NASH is one of the leading causes of cirrhosis. About 10-20% of Americans have fatty liver and a smaller 2-5% has NASH.  TREATMENT  Weight loss, fat restriction, and exercise in overweight patients produces inconsistent results but is worth trying. Good control of diabetes may reduce fatty liver. Eat a balanced, healthy diet. Increase your physical activity. There are no medical or surgical treatments for a fatty liver or NASH, but improving your diet and increasing your exercise may help prevent or reverse some of the damage.

## 2024-03-02 NOTE — Progress Notes (Signed)
 03/02/2024 Bob [name] Bob [name] [id number] [birth year]  Referring provider: Marquetta Sit, MD Primary GI doctor: Dr. Karene Oto  ASSESSMENT AND PLAN:  Dysphagia with GERD intermittently Father with something similar, no autoimmune disease history in family Worse with breads/rice, 2 episodes of feeling of impaction, most recent in August went to ER with impaction, improved with glucogon Has history of asthma when younger, history of hives with heat Rare ETOH, no NSAIDS -Schedule EGD with dilatation to evaluate for stenosis, tumor, erosive/infectious esophagititis, and EOE.   If the EGD is negative and symptoms continue after PPI trial, can consider barium swallow  -I discussed risks of EGD with patient today, including risk of sedation, bleeding or perforation.  -Patient provides understanding and gave verbal consent to proceed. -In the interim patient advised about swallowing precautions.  -Eat slowly, chew food well before swallowing.  -Drink liquids in between each bite to avoid food impaction. - add on pantoprazole 20 mg daily  Rectal bleeding No family history of colon cancer that he knows of 11/09/2021 no anemia, CRP negative This was remote, no current symptoms  Status post appendectomy CT abdomen pelvis 11/2020 early appendicitis  Hepatic steatosis Seen on CT 11/2020 - need LFTs and CBC monitored every 6 months, revaluation every 2-3 years, will check today, consider further work up if elevated -Continue to work on risk factor modification including diet exercise and control of risk factors including blood sugars.   Patient Care Team: Marquetta Sit, MD as PCP - General (Family Medicine)  HISTORY OF PRESENT ILLNESS: 28 y.o. male with a past medical history listed below presents for evaluation of rectal bleeding and dysphagia.   Discussed the use of AI scribe software for clinical note transcription with the patient, who gave verbal consent to  proceed.  History of Present Illness   Bob [name] is a 28 year old male who presents with difficulty swallowing, particularly with starchy foods.  He has experienced progressive dysphagia, especially with starchy foods such as bread and rice. He can tolerate sushi rice and subs with vinegar but struggles with dry or mushy foods. There have been two significant episodes of food impaction, one in June 2021 with chicken finger breading and another in August 2024 with chicken parmesan, the latter requiring emergency room intervention.  He experiences occasional acid reflux, which he associates with the consumption of alcohol and energy drinks. These episodes are diet-related and transient, and he does not take regular medication for heartburn.  His medical history includes asthma diagnosed at birth, which has not affected him since childhood. 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  His family history includes a father with similar swallowing difficulties, possibly related to Barrett's esophagus, though the exact diagnosis is unknown. There is also a general family history of cancer, though specific types are not detailed.      He  reports that he has never smoked. He has never used smokeless tobacco. He reports current alcohol use of about 6.0 standard drinks of alcohol per week. He reports that he does not use drugs.  RELEVANT GI HISTORY, IMAGING AND LABS: Results   RADIOLOGY CT Abdomen: Hepatic steatosis (11/2020)      CBC    Component Value Date/Time   WBC 6.2 11/09/2021 0852   RBC 5.40 11/09/2021 0852   HGB 15.2 11/09/2021 0852   HCT 45.5 11/09/2021 0852   PLT 300.0  11/09/2021 0852   MCV 84.3 11/09/2021 0852   MCH 28.9 11/28/2020 2216   MCHC 33.4 11/09/2021 0852   RDW 13.3 11/09/2021 0852   LYMPHSABS 1.8 11/09/2021 0852   MONOABS 0.5 11/09/2021 0852    EOSABS 0.5 11/09/2021 0852   BASOSABS 0.1 11/09/2021 0852   No results for input(s): "HGB" in the last 8760 hours.  CMP     Component Value Date/Time   NA 140 11/28/2020 2216   K 3.6 11/28/2020 2216   CL 100 11/28/2020 2216   CO2 27 11/28/2020 2216   GLUCOSE 119 (H) 11/28/2020 2216   BUN 13 11/28/2020 2216   CREATININE 1.05 11/28/2020 2216   CALCIUM 9.7 11/28/2020 2216   PROT 7.0 11/28/2020 2216   ALBUMIN 4.3 11/28/2020 2216   AST 32 11/28/2020 2216   ALT 35 11/28/2020 2216   ALKPHOS 51 11/28/2020 2216   BILITOT 1.0 11/28/2020 2216   GFRNONAA >60 11/28/2020 2216   GFRAA NOT CALCULATED 02/05/2013 1318      Latest Ref Rng & Units 11/28/2020   10:16 PM 04/27/2015    8:38 AM 02/05/2013    1:18 PM  Hepatic Function  Total Protein 6.5 - 8.1 g/dL 7.0  7.5  7.7   Albumin 3.5 - 5.0 g/dL 4.3  4.5  4.3   AST 15 - 41 U/L 32  20  26   ALT 0 - 44 U/L 35  17  25   Alk Phosphatase 38 - 126 U/L 51  60  66   Total Bilirubin 0.3 - 1.2 mg/dL 1.0  0.9  0.5   Bilirubin, Direct 0.0 - 0.3 mg/dL  0.2        Current Medications:        Current Outpatient Medications (Other):    amphetamine -dextroamphetamine  (ADDERALL XR) 20 MG 24 hr capsule, Take 1 capsule (20 mg total) by mouth every morning.   fluorouracil  (EFUDEX ) 5 % cream, Apply topically 2 (two) times daily.   hydrocortisone  (ANUSOL -HC) 25 MG suppository, Place 1 suppository (25 mg total) rectally 2 (two) times daily.   LORazepam  (ATIVAN ) 0.5 MG tablet, Take 1 tablet (0.5 mg total) by mouth every 6 (six) hours as needed for anxiety.   pantoprazole (PROTONIX) 20 MG tablet, Take 1 tablet (20 mg total) by mouth daily.  Medical History:  Past Medical History:  Diagnosis Date   ADD (attention deficit disorder)    Broken arm 2002   right   Depression    Allergies:  Allergies  Allergen Reactions   Codeine Rash   Sulfa Antibiotics Rash     Surgical History:  He  has a past surgical history that includes broken arm (Right)  and laparoscopic appendectomy (N/A, 11/29/2020). Family History:  His family history includes Alcohol abuse in his paternal grandmother; Cancer in his paternal aunt and paternal grandmother; Diabetes in his maternal grandfather; Heart disease in his maternal grandmother; Hyperlipidemia in his maternal grandfather; Hypertension in his maternal grandfather.  REVIEW OF SYSTEMS  : All other systems reviewed and negative except where noted in the History of Present Illness.  PHYSICAL EXAM: BP 130/88   Pulse 93   Ht 5\' 9"  (1.753 m)   Wt 197 lb (89.4 kg)   BMI 29.09 kg/m  General Appearance: Well nourished, in no apparent distress. Respiratory: Respiratory effort normal, BS equal bilaterally without rales, rhonchi, wheezing. Cardio: RRR with no MRGs. Abdomen: Soft,  Non-distended ,active bowel sounds. No tenderness . Without guarding and Without rebound. No masses. Rectal:  Not evaluated Musculoskeletal: Full ROM, Normal gait Neuro: Alert and  oriented x4;  No focal deficits. Psych:  Cooperative. Normal mood and affect.       Edmonia Gottron, PA-C 3:35 PM

## 2024-03-04 LAB — IGA: Immunoglobulin A: 137 mg/dL (ref 47–310)

## 2024-03-04 LAB — TISSUE TRANSGLUTAMINASE, IGA: (tTG) Ab, IgA: <1 U/mL

## 2024-03-09 NOTE — Progress Notes (Signed)
 Agree with the assessment and plan as outlined by Quentin Mulling, PA-C. ? ?Keron Neenan, DO, FACG ? ?

## 2024-03-12 ENCOUNTER — Encounter (HOSPITAL_COMMUNITY): Payer: Self-pay

## 2024-03-12 ENCOUNTER — Ambulatory Visit (HOSPITAL_COMMUNITY)
Admission: EM | Admit: 2024-03-12 | Discharge: 2024-03-12 | Disposition: A | Discharge status: Home / Self Care | Attending: Emergency Medicine | Admitting: Emergency Medicine

## 2024-03-12 DIAGNOSIS — S0551XA Penetrating wound with foreign body of right eyeball, initial encounter: Secondary | ICD-10-CM

## 2024-03-12 DIAGNOSIS — S0501XA Injury of conjunctiva and corneal abrasion without foreign body, right eye, initial encounter: Secondary | ICD-10-CM

## 2024-03-12 MED ORDER — TETRACAINE HCL 0.5 % OP SOLN
OPHTHALMIC | Status: AC
  Filled 2024-03-12: qty 4

## 2024-03-12 MED ORDER — EYE WASH OP SOLN
OPHTHALMIC | Status: AC
  Filled 2024-03-12: qty 118

## 2024-03-12 MED ORDER — CIPROFLOXACIN HCL 0.3 % OP SOLN
1.0000 [drp] | OPHTHALMIC | 0 refills | Status: DC
Start: 2024-03-12 — End: 2024-05-25

## 2024-03-12 MED ORDER — FLUORESCEIN SODIUM 1 MG OP STRP
ORAL_STRIP | OPHTHALMIC | Status: AC
  Filled 2024-03-12: qty 1

## 2024-03-12 NOTE — Discharge Instructions (Addendum)
 Please use the ciprofloxacin eyedrops as directed.  While awake, 1 drop in the right eye every 2 hours for 2 days.  Then 1 drop in the right eye every 4 hours for 5 more days. Call your eye specialist to make an appointment for follow-up Do not wear contacts for the duration of this treatment, and until you see your eye specialist.

## 2024-03-12 NOTE — ED Triage Notes (Signed)
 Pt states right eye pain since yesterday.  States he thinks he might have gotten something in it.  States he has been flushing it out with saline at home with no change in his symptoms.

## 2024-03-12 NOTE — ED Provider Notes (Signed)
 [facility]    CSN: [id number] Arrival date & time: 03/12/24  9528      History   Chief Complaint Chief Complaint  Patient presents with   Eye Pain    HPI Bob [name] is a 28 y.o. male.  Right eye pain since yesterday Thinks he may have gotten something in it. Was working cutting wood and brushing off mildew. Had sunglasses on Has been flushing with water at home Does wear contacts   Past Medical History:  Diagnosis Date   ADD (attention deficit disorder)    Broken arm 2002   right   Depression     Patient Active Problem List   Diagnosis Date Noted   Sinusitis 07/11/2015   Acne 04/29/2013   ADD (attention deficit disorder) 03/30/2013   MDD (major depressive disorder), single episode, severe (HCC) 02/08/2013   Mild intermittent asthma 01/22/2013    Past Surgical History:  Procedure Laterality Date   broken arm Right    pins at age 28   LAPAROSCOPIC APPENDECTOMY N/A 11/29/2020   Procedure: APPENDECTOMY LAPAROSCOPIC;  Surgeon: Enid Harry, MD;  Location: [facility] OR;  Service: General;  Laterality: N/A;       Home Medications    Prior to Admission medications   Medication Sig Start Date End Date Taking? Authorizing Provider  ciprofloxacin (CILOXAN) 0.3 % ophthalmic solution Place 1 drop into the left eye as directed. Administer 1 drop every 2 hours while awake for 2 days. Then 1 drop every 4 hours while awake for the next 5 days. 03/12/24  Yes Jackston O[place], Ivette Marks, PA-C  amphetamine -dextroamphetamine  (ADDERALL XR) 20 MG 24 hr capsule Take 1 capsule (20 mg total) by mouth every morning. 01/30/24   Burchette, Marijean Shouts, MD  fluorouracil  (EFUDEX ) 5 % cream Apply topically 2 (two) times daily. 01/22/21   Dot Gazella, DPM  hydrocortisone  (ANUSOL -HC) 25 MG suppository Place 1 suppository (25 mg total) rectally 2 (two) times daily. 11/09/21   Burchette, Marijean Shouts, MD  LORazepam  (ATIVAN ) 0.5 MG tablet Take 1 tablet (0.5 mg total) by mouth every 6 (six) hours  as needed for anxiety. 01/30/24   Burchette, Marijean Shouts, MD  pantoprazole  (PROTONIX ) 20 MG tablet Take 1 tablet (20 mg total) by mouth daily. 03/02/24   Edmonia Gottron, PA-C    Family History Family History  Problem Relation Age of Onset   Heart disease Maternal Grandmother    Hypertension Maternal Grandfather    Hyperlipidemia Maternal Grandfather    Diabetes Maternal Grandfather    Alcohol abuse Paternal Grandmother    Cancer Paternal Grandmother        lung   Cancer Paternal Aunt        colon   Esophageal cancer Neg Hx    Liver disease Neg Hx    Colon cancer Neg Hx     Social History Social History   Tobacco Use   Smoking status: Never   Smokeless tobacco: Never  Vaping Use   Vaping status: Never Used  Substance Use Topics   Alcohol use: Yes    Alcohol/week: 6.0 standard drinks of alcohol    Types: 6 Cans of beer per week   Drug use: No     Allergies   Codeine and Sulfa antibiotics   Review of Systems Review of Systems Per HPI  Physical Exam Triage Vital Signs ED Triage Vitals  Encounter Vitals Group     BP 03/12/24 1021 139/85     Systolic BP Percentile --  Diastolic BP Percentile --      Pulse Rate 03/12/24 1021 (!) 56     Resp 03/12/24 1021 16     Temp 03/12/24 1021 98 F (36.7 C)     Temp Source 03/12/24 1021 Oral     SpO2 03/12/24 1021 97 %     Weight --      Height --      Head Circumference --      Peak Flow --      Pain Score 03/12/24 1020 7     Pain Loc --      Pain Education --      Exclude from Growth Chart --    No data found.  Updated Vital Signs BP 139/85 (BP Location: Right Arm)   Pulse (!) 56   Temp 98 F (36.7 C) (Oral)   Resp 16   SpO2 97%   Visual Acuity Right Eye Distance: 20/70 (no contact) Left Eye Distance: 20/20 (contact in left eye) Bilateral Distance: 20/30 (contact in left eye)  Right Eye Near:   Left Eye Near:    Bilateral Near:     Physical Exam Vitals and nursing note reviewed.  Constitutional:       Appearance: Normal appearance.  HENT:     Mouth/Throat:     Mouth: Mucous membranes are moist.     Pharynx: Oropharynx is clear.  Eyes:     General: Lids are normal. Vision grossly intact.        Right eye: Foreign body present. No discharge.     Extraocular Movements: Extraocular movements intact.     Conjunctiva/sclera: Conjunctivae normal.     Right eye: Right conjunctiva is not injected. No hemorrhage.    Pupils: Pupils are equal, round, and reactive to light.     Right eye: Corneal abrasion and fluorescein uptake present.      Comments: There is one pin sized area at the 12 o clock position of right cornea, foreign body vs small abrasion. Slight fluorescein uptake  Cardiovascular:     Rate and Rhythm: Normal rate and regular rhythm.     Heart sounds: Normal heart sounds.  Pulmonary:     Effort: Pulmonary effort is normal.     Breath sounds: Normal breath sounds.  Musculoskeletal:     Cervical back: Normal range of motion.  Skin:    General: Skin is warm and dry.  Neurological:     Mental Status: He is alert and oriented to person, place, and time.     UC Treatments / Results  Labs (all labs ordered are listed, but only abnormal results are displayed) Labs Reviewed - No data to display  EKG  Radiology No results found.  Procedures Procedures (including critical care time)  Medications Ordered in UC Medications - No data to display  Initial Impression / Assessment and Plan / UC Course  I have reviewed the triage vital signs and the nursing notes.  Pertinent labs & imaging results that were available during my care of the patient were reviewed by me and considered in my medical decision making (see chart for details).  Tetracaine and fluorescein exam with Woods lamp.  Possible foreign body versus small corneal abrasion.  Patient wears contacts, treat with ciprofloxacin eyedrops.  He does have an eye doctor he can follow-up with early next week.  Return  precautions.  Patient agrees to plan, no questions  Final Clinical Impressions(s) / UC Diagnoses   Final diagnoses:  Abrasion of right  cornea, initial encounter  Intraocular foreign body of right eye, initial encounter     Discharge Instructions      Please use the ciprofloxacin eyedrops as directed.  While awake, 1 drop in the right eye every 2 hours for 2 days.  Then 1 drop in the right eye every 4 hours for 5 more days. Call your eye specialist to make an appointment for follow-up Do not wear contacts for the duration of this treatment, and until you see your eye specialist.    ED Prescriptions     Medication Sig Dispense Auth. Provider   ciprofloxacin (CILOXAN) 0.3 % ophthalmic solution Place 1 drop into the left eye as directed. Administer 1 drop every 2 hours while awake for 2 days. Then 1 drop every 4 hours while awake for the next 5 days. 5 mL Trenita Hulme, Ivette Marks, PA-C      PDMP not reviewed this encounter.   Curley Hogen, Ivette Marks, PA-C 03/12/24 1100

## 2024-03-16 ENCOUNTER — Telehealth: Payer: Self-pay | Admitting: Gastroenterology

## 2024-03-16 ENCOUNTER — Ambulatory Visit (AMBULATORY_SURGERY_CENTER): Admitting: Gastroenterology

## 2024-03-16 ENCOUNTER — Encounter: Payer: Self-pay | Admitting: Gastroenterology

## 2024-03-16 VITALS — BP 121/77 | HR 76 | Temp 97.8°F | Resp 14 | Ht 69.0 in | Wt 197.0 lb

## 2024-03-16 DIAGNOSIS — K760 Fatty (change of) liver, not elsewhere classified: Secondary | ICD-10-CM

## 2024-03-16 DIAGNOSIS — R1319 Other dysphagia: Secondary | ICD-10-CM

## 2024-03-16 DIAGNOSIS — R110 Nausea: Secondary | ICD-10-CM

## 2024-03-16 DIAGNOSIS — R1310 Dysphagia, unspecified: Secondary | ICD-10-CM | POA: Diagnosis not present

## 2024-03-16 DIAGNOSIS — K200 Eosinophilic esophagitis: Secondary | ICD-10-CM

## 2024-03-16 DIAGNOSIS — K219 Gastro-esophageal reflux disease without esophagitis: Secondary | ICD-10-CM | POA: Diagnosis not present

## 2024-03-16 MED ORDER — PANTOPRAZOLE SODIUM 40 MG PO TBEC: 40.0000 mg | DELAYED_RELEASE_TABLET | Freq: Two times a day (BID) | ORAL | 0 refills | Status: DC

## 2024-03-16 MED ORDER — SODIUM CHLORIDE 0.9 % IV SOLN
4.0000 mg | Freq: Once | INTRAVENOUS | Status: AC
Start: 2024-03-16 — End: ?

## 2024-03-16 MED ORDER — SODIUM CHLORIDE 0.9 % IV SOLN: 500.0000 mL | INTRAVENOUS | Status: DC

## 2024-03-16 NOTE — Patient Instructions (Addendum)
 Follow a soft diet today, advance as tolerated tomorrow. Continue present medications Awaiting pathology results Increase Protonix  to 40 mg PO BID for 8 weeks. Repeat upper endoscopy in Wednesday 05/05/24 to check healing and for re-treatment. Return to GI clinic for follow up at scheduled appointment   YOU HAD AN ENDOSCOPIC PROCEDURE TODAY AT THE [facility]:   Refer to the procedure report that was given to you for any specific questions about what was found during the examination.  If the procedure report does not answer your questions, please call your gastroenterologist to clarify.  If you requested that your care partner not be given the details of your procedure findings, then the procedure report has been included in a sealed envelope for you to review at your convenience later.  YOU SHOULD EXPECT: Some feelings of bloating in the abdomen. Passage of more gas than usual.  Walking can help get rid of the air that was put into your GI tract during the procedure and reduce the bloating. If you had a lower endoscopy (such as a colonoscopy or flexible sigmoidoscopy) you may notice spotting of blood in your stool or on the toilet paper. If you underwent a bowel prep for your procedure, you may not have a normal bowel movement for a few days.  Please Note:  You might notice some irritation and congestion in your nose or some drainage.  This is from the oxygen used during your procedure.  There is no need for concern and it should clear up in a day or so.  SYMPTOMS TO REPORT IMMEDIATELY:  Following upper endoscopy (EGD)  Vomiting of blood or coffee ground material  New chest pain or pain under the shoulder blades  Painful or persistently difficult swallowing  New shortness of breath  Fever of 100F or higher  Black, tarry-looking stools  For urgent or emergent issues, a gastroenterologist can be reached at any hour by calling [phone]. Do not use MyChart messaging for urgent  concerns.    DIET:  We do recommend a small meal at first, but then you may proceed to your regular diet.  Drink plenty of fluids but you should avoid alcoholic beverages for 24 hours.  ACTIVITY:  You should plan to take it easy for the rest of today and you should NOT DRIVE or use heavy machinery until tomorrow (because of the sedation medicines used during the test).    FOLLOW UP: Our staff will call the number listed on your records the next business day following your procedure.  We will call around 7:15- 8:00 am to check on you and address any questions or concerns that you may have regarding the information given to you following your procedure. If we do not reach you, we will leave a message.     If any biopsies were taken you will be contacted by phone or by letter within the next 1-3 weeks.  Please call us  at [phone] if you have not heard about the biopsies in 3 weeks.    SIGNATURES/CONFIDENTIALITY: You and/or your care partner have signed paperwork which will be entered into your electronic medical record.  These signatures attest to the fact that that the information above on your After Visit Summary has been reviewed and is understood.  Full responsibility of the confidentiality of this discharge information lies with you and/or your care-partner.

## 2024-03-16 NOTE — Progress Notes (Signed)
 Patient states there have been no changes to medical or surgical history since time of pre-visit.

## 2024-03-16 NOTE — Telephone Encounter (Signed)
 Returned the patient's phone call. Answered some post op questions they had regarding new medication and follow up appt. Pt verbalized understanding.

## 2024-03-16 NOTE — Op Note (Signed)
 [facility] Patient Name: Bob [name] Code Procedure Date: 03/16/2024 7:37 AM MRN: [id number] Endoscopist: Harry Lindau , MD, [id number] Age: 28 Referring MD:  Date of Birth: 05/24/1996 Gender: Male Account #: [id number] Procedure:                Upper GI endoscopy Indications:              Dysphagia Medicines:                Monitored Anesthesia Care Procedure:                Pre-Anesthesia Assessment:                           - Prior to the procedure, a History and Physical                            was performed, and patient medications and                            allergies were reviewed. The patient's tolerance of                            previous anesthesia was also reviewed. The risks                            and benefits of the procedure and the sedation                            options and risks were discussed with the patient.                            All questions were answered, and informed consent                            was obtained. Prior Anticoagulants: The patient has                            taken no anticoagulant or antiplatelet agents. ASA                            Grade Assessment: II - A patient with mild systemic                            disease. After reviewing the risks and benefits,                            the patient was deemed in satisfactory condition to                            undergo the procedure.                           After obtaining informed consent, the endoscope was  passed under direct vision. Throughout the                            procedure, the patient's blood pressure, pulse, and                            oxygen saturations were monitored continuously. The                            Olympus Scope [id number] was introduced through the                            mouth, and advanced to the second part of duodenum.                            The upper GI endoscopy was  accomplished without                            difficulty. The patient tolerated the procedure                            well. Scope In: Scope Out: Findings:                 Mucosal changes including ringed esophagus and                            longitudinal furrows were found in the middle third                            of the esophagus and in the lower third of the                            esophagus. Esophageal findings were graded using                            the Eosinophilic Esophagitis Endoscopic Reference                            Score (EoE-EREFS) as: Edema Grade 0 Normal                            (distinct vascular markings), Rings Grade 1 Mild                            (subtle circumferential ridges seen on esophageal                            distension), Exudates Grade 0 None (no white                            lesions seen), Furrows Grade 1 Mild (vertical lines  without visible depth) and Stricture none (no                            stricture found). A guidewire was placed and the                            scope was withdrawn. Dilation was performed with a                            Savary dilator with minimal resistance at 17 mm and                            mild resistance at 18 mm. The dilation site was                            examined following endoscope reinsertion and showed                            mild mucosal disruption x2, consistent with                            appropriate dilation. Biopsies were then obtained                            from the proximal and distal esophagus with cold                            forceps for histology of suspected eosinophilic                            esophagitis. Biopsies notable for positive "tug                            sign". Estimated blood loss was minimal.                           The Z-line was regular and was found 43 cm from the                            incisors.                            The entire examined stomach was normal.                           The examined duodenum was normal. Complications:            No immediate complications. Estimated Blood Loss:     Estimated blood loss was minimal. Impression:               - Esophageal mucosal changes suspicious for                            eosinophilic esophagitis. Dilated with 17 and 18 mm  Savary dilator. Biopsies were taken with a cold                            forceps for evaluation of eosinophilic esophagitis.                           - Z-line regular, 43 cm from the incisors.                           - Normal stomach.                           - Normal examined duodenum. Recommendation:           - Patient has a contact number available for                            emergencies. The signs and symptoms of potential                            delayed complications were discussed with the                            patient. Return to normal activities tomorrow.                            Written discharge instructions were provided to the                            patient.                           - Soft diet today, then slowly advance as tolerated                            tomorrow per post dilation protocol.                           - Continue present medications.                           - Await pathology results.                           - Increase Protonix  (pantoprazole ) to 40 mg PO BID                            for 8 weeks, and if clinically improving and                            depending on repeat endoscopic findings, will                            adjust medication at that point.                           -  Repeat upper endoscopy in 6-8 weeks to check                            healing and for retreatment.                           - Return to GI clinic in 3 months. Harry Lindau, MD 03/16/2024 8:26:15 AM

## 2024-03-16 NOTE — Progress Notes (Signed)
 Pt A/O x 3, gd SR's, pleased with anesthesia, report to RN

## 2024-03-16 NOTE — Telephone Encounter (Signed)
 Requesting call back to speak with a nurse at [phone]   Please advise.

## 2024-03-16 NOTE — Progress Notes (Signed)
 GASTROENTEROLOGY PROCEDURE H&P NOTE   Primary Care Physician: Marquetta Sit, MD    Reason for Procedure:   Dysphagia, GERD  Plan:    EGD  Patient is appropriate for endoscopic procedure(s) in the ambulatory ([facility]) setting.  The nature of the procedure, as well as the risks, benefits, and alternatives were carefully and thoroughly reviewed with the patient. Ample time for discussion and questions allowed. The patient understood, was satisfied, and agreed to proceed.     HPI: Bob [name] is a 28 y.o. male who presents for EGD for evaluation of dysphagia, GERD.  Patient was most recently seen in the Gastroenterology Clinic on 03/02/2024.  No interval change in medical history since that appointment. Please refer to that note for full details regarding GI history and clinical presentation.   Past Medical History:  Diagnosis Date   ADD (attention deficit disorder)    Broken arm 2002   right   Depression     Past Surgical History:  Procedure Laterality Date   broken arm Right    pins at age 77   LAPAROSCOPIC APPENDECTOMY N/A 11/29/2020   Procedure: APPENDECTOMY LAPAROSCOPIC;  Surgeon: Enid Harry, MD;  Location: [facility] OR;  Service: General;  Laterality: N/A;    Prior to Admission medications   Medication Sig Start Date End Date Taking? Authorizing Provider  amphetamine -dextroamphetamine  (ADDERALL XR) 20 MG 24 hr capsule Take 1 capsule (20 mg total) by mouth every morning. 01/30/24  Yes Burchette, Marijean Shouts, MD  ciprofloxacin  (CILOXAN ) 0.3 % ophthalmic solution Place 1 drop into the left eye as directed. Administer 1 drop every 2 hours while awake for 2 days. Then 1 drop every 4 hours while awake for the next 5 days. 03/12/24  Yes Rising, Ivette Marks, PA-C  LORazepam  (ATIVAN ) 0.5 MG tablet Take 1 tablet (0.5 mg total) by mouth every 6 (six) hours as needed for anxiety. 01/30/24  Yes Burchette, Marijean Shouts, MD  pantoprazole  (PROTONIX ) 20 MG tablet Take 1 tablet (20 mg total) by  mouth daily. 03/02/24  Yes Santina Cull R, PA-C  fluorouracil  (EFUDEX ) 5 % cream Apply topically 2 (two) times daily. Patient not taking: Reported on 03/16/2024 01/22/21   Dot Gazella, DPM  hydrocortisone  (ANUSOL -HC) 25 MG suppository Place 1 suppository (25 mg total) rectally 2 (two) times daily. Patient not taking: Reported on 03/16/2024 11/09/21   Marquetta Sit, MD    Current Outpatient Medications  Medication Sig Dispense Refill   amphetamine -dextroamphetamine  (ADDERALL XR) 20 MG 24 hr capsule Take 1 capsule (20 mg total) by mouth every morning. 30 capsule 0   ciprofloxacin  (CILOXAN ) 0.3 % ophthalmic solution Place 1 drop into the left eye as directed. Administer 1 drop every 2 hours while awake for 2 days. Then 1 drop every 4 hours while awake for the next 5 days. 5 mL 0   LORazepam  (ATIVAN ) 0.5 MG tablet Take 1 tablet (0.5 mg total) by mouth every 6 (six) hours as needed for anxiety. 30 tablet 0   pantoprazole  (PROTONIX ) 20 MG tablet Take 1 tablet (20 mg total) by mouth daily. 30 tablet 1   fluorouracil  (EFUDEX ) 5 % cream Apply topically 2 (two) times daily. (Patient not taking: Reported on 03/16/2024) 40 g 1   hydrocortisone  (ANUSOL -HC) 25 MG suppository Place 1 suppository (25 mg total) rectally 2 (two) times daily. (Patient not taking: Reported on 03/16/2024) 20 suppository 0   Current Facility-Administered Medications  Medication Dose Route Frequency Provider Last Rate Last Admin   0.9 %  sodium chloride  infusion  500 mL Intravenous Continuous Wessie Shanks V, DO       ondansetron  (ZOFRAN ) 4 mg in sodium chloride  0.9 % 50 mL IVPB  4 mg Intravenous Once Karsten Howry V, DO        Allergies as of 03/16/2024 - Review Complete 03/16/2024  Allergen Reaction Noted   Codeine Rash 01/26/2017   Sulfa antibiotics Rash 01/26/2017    Family History  Problem Relation Age of Onset   Cancer Paternal Aunt        colon   Heart disease Maternal Grandmother    Hypertension Maternal  Grandfather    Hyperlipidemia Maternal Grandfather    Diabetes Maternal Grandfather    Alcohol abuse Paternal Grandmother    Cancer Paternal Grandmother        lung   Esophageal cancer Neg Hx    Liver disease Neg Hx    Colon cancer Neg Hx    Rectal cancer Neg Hx    Stomach cancer Neg Hx     Social History   Socioeconomic History   Marital status: Single    Spouse name: Not on file   Number of children: 0   Years of education: Not on file   Highest education level: Not on file  Occupational History   Occupation: [employer]  Tobacco Use   Smoking status: Never   Smokeless tobacco: Never  Vaping Use   Vaping status: Never Used  Substance and Sexual Activity   Alcohol use: Yes    Alcohol/week: 6.0 standard drinks of alcohol    Types: 6 Cans of beer per week   Drug use: No   Sexual activity: Yes    Birth control/protection: Pill  Other Topics Concern   Not on file  Social History Narrative   Not on file   Social Drivers of Health   Financial Resource Strain: Not on file  Food Insecurity: Not on file  Transportation Needs: Not on file  Physical Activity: Not on file  Stress: Not on file  Social Connections: Not on file  Intimate Partner Violence: Not on file    Physical Exam: Vital signs in last 24 hours: @BP  (!) 149/77   Pulse 63   Temp 97.8 F (36.6 C) (Skin)   Ht 5\' 9"  (1.753 m)   Wt 197 lb (89.4 kg)   SpO2 97%   BMI 29.09 kg/m  GEN: NAD EYE: Sclerae anicteric ENT: MMM CV: Non-tachycardic Pulm: CTA b/l GI: Soft, NT/ND NEURO:  Alert & Oriented x 3   Harry Lindau, DO [facility]   03/16/2024 7:55 AM

## 2024-03-17 ENCOUNTER — Telehealth: Payer: Self-pay

## 2024-03-17 NOTE — Telephone Encounter (Signed)
 Left message

## 2024-03-18 LAB — SURGICAL PATHOLOGY

## 2024-03-19 ENCOUNTER — Ambulatory Visit: Payer: Self-pay | Admitting: Gastroenterology

## 2024-04-07 ENCOUNTER — Other Ambulatory Visit: Payer: Self-pay | Admitting: Gastroenterology

## 2024-04-07 DIAGNOSIS — R1319 Other dysphagia: Secondary | ICD-10-CM

## 2024-05-05 ENCOUNTER — Encounter: Admitting: Gastroenterology

## 2024-05-25 ENCOUNTER — Encounter: Payer: Self-pay | Admitting: Gastroenterology

## 2024-05-25 ENCOUNTER — Ambulatory Visit: Admitting: Gastroenterology

## 2024-05-25 VITALS — BP 115/71 | HR 70 | Temp 97.9°F | Resp 16 | Ht 69.0 in | Wt 197.0 lb

## 2024-05-25 DIAGNOSIS — R1319 Other dysphagia: Secondary | ICD-10-CM

## 2024-05-25 DIAGNOSIS — K2950 Unspecified chronic gastritis without bleeding: Secondary | ICD-10-CM | POA: Diagnosis not present

## 2024-05-25 DIAGNOSIS — K200 Eosinophilic esophagitis: Secondary | ICD-10-CM

## 2024-05-25 DIAGNOSIS — K2970 Gastritis, unspecified, without bleeding: Secondary | ICD-10-CM

## 2024-05-25 DIAGNOSIS — B9681 Helicobacter pylori [H. pylori] as the cause of diseases classified elsewhere: Secondary | ICD-10-CM

## 2024-05-25 MED ORDER — SODIUM CHLORIDE 0.9 % IV SOLN: 500.0000 mL | Freq: Once | INTRAVENOUS | Status: DC

## 2024-05-25 NOTE — Progress Notes (Signed)
 GASTROENTEROLOGY PROCEDURE H&P NOTE   Primary Care Physician: Micheal Wolm ORN, MD    Reason for Procedure:  Eosinophilic esophagitis  Plan:    EGD  Patient is appropriate for endoscopic procedure(s) in the ambulatory ([facility]) setting.  The nature of the procedure, as well as the risks, benefits, and alternatives were carefully and thoroughly reviewed with the patient. Ample time for discussion and questions allowed. The patient understood, was satisfied, and agreed to proceed.     HPI: Bob [name] is a 28 y.o. male who presents for follow-up EGD after recent diagnosis of eosinophilic esophagitis.  Underwent EGD on 03/16/2024 which was notable for a ringed esophagus with linear furrows (path: Up to 50 eosinophils per hpf in distal and proximal esophagus) with an otherwise normal stomach and duodenum.  Esophagus dilated with 17 and 18 mm Savary dilator with appropriate mucosal disruption x 2.  Was diagnosed with EOE and treated with high-dose Protonix  40 mg twice daily for the last 2 months and presents today for reevaluation.  Past Medical History:  Diagnosis Date   ADD (attention deficit disorder)    Broken arm 2002   right   Depression     Past Surgical History:  Procedure Laterality Date   broken arm Right    pins at age 82   LAPAROSCOPIC APPENDECTOMY N/A 11/29/2020   Procedure: APPENDECTOMY LAPAROSCOPIC;  Surgeon: Ebbie Cough, MD;  Location: [facility] OR;  Service: General;  Laterality: N/A;    Prior to Admission medications   Medication Sig Start Date End Date Taking? Authorizing Provider  amphetamine -dextroamphetamine  (ADDERALL XR) 20 MG 24 hr capsule Take 1 capsule (20 mg total) by mouth every morning. 01/30/24   Burchette, Wolm ORN, MD  ciprofloxacin  (CILOXAN ) 0.3 % ophthalmic solution Place 1 drop into the left eye as directed. Administer 1 drop every 2 hours while awake for 2 days. Then 1 drop every 4 hours while awake for the next 5 days. 03/12/24   Rising,  Asberry, PA-C  fluorouracil  (EFUDEX ) 5 % cream Apply topically 2 (two) times daily. Patient not taking: Reported on 03/16/2024 01/22/21   Janit Thresa HERO, DPM  hydrocortisone  (ANUSOL -HC) 25 MG suppository Place 1 suppository (25 mg total) rectally 2 (two) times daily. Patient not taking: Reported on 03/16/2024 11/09/21   Micheal Wolm ORN, MD  LORazepam  (ATIVAN ) 0.5 MG tablet Take 1 tablet (0.5 mg total) by mouth every 6 (six) hours as needed for anxiety. 01/30/24   Burchette, Wolm ORN, MD  pantoprazole  (PROTONIX ) 40 MG tablet TAKE 1 TABLET BY MOUTH TWICE A DAY 04/07/24   Gioia Ranes V, DO    Current Outpatient Medications  Medication Sig Dispense Refill   amphetamine -dextroamphetamine  (ADDERALL XR) 20 MG 24 hr capsule Take 1 capsule (20 mg total) by mouth every morning. 30 capsule 0   ciprofloxacin  (CILOXAN ) 0.3 % ophthalmic solution Place 1 drop into the left eye as directed. Administer 1 drop every 2 hours while awake for 2 days. Then 1 drop every 4 hours while awake for the next 5 days. 5 mL 0   fluorouracil  (EFUDEX ) 5 % cream Apply topically 2 (two) times daily. (Patient not taking: Reported on 03/16/2024) 40 g 1   hydrocortisone  (ANUSOL -HC) 25 MG suppository Place 1 suppository (25 mg total) rectally 2 (two) times daily. (Patient not taking: Reported on 03/16/2024) 20 suppository 0   LORazepam  (ATIVAN ) 0.5 MG tablet Take 1 tablet (0.5 mg total) by mouth every 6 (six) hours as needed for anxiety. 30 tablet 0  pantoprazole  (PROTONIX ) 40 MG tablet TAKE 1 TABLET BY MOUTH TWICE A DAY 180 tablet 0   Current Facility-Administered Medications  Medication Dose Route Frequency Provider Last Rate Last Admin   ondansetron  (ZOFRAN ) 4 mg in sodium chloride  0.9 % 50 mL IVPB  4 mg Intravenous Once Nathanael Krist V, DO        Allergies as of 05/25/2024 - Review Complete 03/16/2024  Allergen Reaction Noted   Codeine Rash 01/26/2017   Sulfa antibiotics Rash 01/26/2017    Family History  Problem  Relation Age of Onset   Cancer Paternal Aunt        colon   Heart disease Maternal Grandmother    Hypertension Maternal Grandfather    Hyperlipidemia Maternal Grandfather    Diabetes Maternal Grandfather    Alcohol abuse Paternal Grandmother    Cancer Paternal Grandmother        lung   Esophageal cancer Neg Hx    Liver disease Neg Hx    Colon cancer Neg Hx    Rectal cancer Neg Hx    Stomach cancer Neg Hx     Social History   Socioeconomic History   Marital status: Single    Spouse name: Not on file   Number of children: 0   Years of education: Not on file   Highest education level: Not on file  Occupational History   Occupation: [employer]  Tobacco Use   Smoking status: Never   Smokeless tobacco: Never  Vaping Use   Vaping status: Never Used  Substance and Sexual Activity   Alcohol use: Yes    Alcohol/week: 6.0 standard drinks of alcohol    Types: 6 Cans of beer per week   Drug use: No   Sexual activity: Yes    Birth control/protection: Pill  Other Topics Concern   Not on file  Social History Narrative   Not on file   Social Drivers of Health   Financial Resource Strain: Not on file  Food Insecurity: Not on file  Transportation Needs: Not on file  Physical Activity: Not on file  Stress: Not on file  Social Connections: Not on file  Intimate Partner Violence: Not on file    Physical Exam: Vital signs in last 24 hours: @There  were no vitals taken for this visit. GEN: NAD EYE: Sclerae anicteric ENT: MMM CV: Non-tachycardic Pulm: CTA b/l GI: Soft, NT/ND NEURO:  Alert & Oriented x 3   Sandor Flatter, DO [facility]   05/25/2024 7:36 AM

## 2024-05-25 NOTE — Op Note (Signed)
 [facility] Patient Name: Bob [name] Procedure Date: 05/25/2024 8:20 AM MRN: [id number] Endoscopist: Sandor Flatter , MD, [id number] Age: 28 Referring MD:  Date of Birth: 1996-09-13 Gender: Male Account #: [id number] Procedure:                Upper GI endoscopy Indications:              Dysphagia, Follow-up of eosinophilic esophagitis                           Underwent EGD on 03/16/2024 which was notable for a                            ringed esophagus with linear furrows (path: Up to                            50 eosinophils per hpf in distal and proximal                            esophagus) with an otherwise normal stomach and                            duodenum. Esophagus dilated with 17 and 18 mm                            Savary dilator with appropriate mucosal disruption                            x 2. Was diagnosed with EOE and treated with                            high-dose Protonix  40 mg twice daily for the last 2                            months and presents today for reevaluation. Medicines:                Monitored Anesthesia Care Procedure:                Pre-Anesthesia Assessment:                           - Prior to the procedure, a History and Physical                            was performed, and patient medications and                            allergies were reviewed. The patient's tolerance of                            previous anesthesia was also reviewed. The risks                            and benefits of the procedure and the sedation  options and risks were discussed with the patient.                            All questions were answered, and informed consent                            was obtained. Prior Anticoagulants: The patient has                            taken no anticoagulant or antiplatelet agents. ASA                            Grade Assessment: II - A patient with mild systemic                             disease. After reviewing the risks and benefits,                            the patient was deemed in satisfactory condition to                            undergo the procedure.                           After obtaining informed consent, the endoscope was                            passed under direct vision. Throughout the                            procedure, the patient's blood pressure, pulse, and                            oxygen saturations were monitored continuously. The                            Olympus Scope [id number] was introduced through the                            mouth, and advanced to the second part of duodenum.                            The upper GI endoscopy was accomplished without                            difficulty. The patient tolerated the procedure                            well. Scope In: Scope Out: Findings:                 Mucosal changes including feline appearance and                            longitudinal furrows  were found in the middle third                            of the esophagus and in the lower third of the                            esophagus. Esophageal findings were graded using                            the Eosinophilic Esophagitis Endoscopic Reference                            Score (EoE-EREFS) as: Edema Grade 0 Normal                            (distinct vascular markings), Rings Grade 1 Mild                            (subtle circumferential ridges seen on esophageal                            distension), Exudates Grade 0 None (no white                            lesions seen), Furrows Grade 1 Mild (vertical lines                            without visible depth) and Stricture none (no                            stricture found). A guidewire was placed and the                            scope was withdrawn. Dilation was performed with a                            Savary dilator with no resistance at 18 mm. The                             dilation site was examined following endoscope                            reinsertion and showed no bleeding, mucosal tear or                            perforation. Biopsies were obtained from the                            proximal and distal esophagus with cold forceps for                            histology of suspected eosinophilic esophagitis.  Estimated blood loss was minimal.                           The Z-line was regular and was found 43 cm from the                            incisors.                           Diffuse mild inflammation characterized by                            congestion (edema) was found in the gastric fundus                            and in the gastric body. Biopsies were taken with a                            cold forceps for Helicobacter pylori testing.                            Estimated blood loss was minimal.                           The incisura, gastric antrum and pylorus were                            normal.                           The examined duodenum was normal. Complications:            No immediate complications. Estimated Blood Loss:     Estimated blood loss was minimal. Impression:               - Esophageal mucosal changes consistent with                            eosinophilic esophagitis. Dilated with 18 mm Savary                            dilator. Biopsies were taken with a cold forceps                            for evaluation of eosinophilic esophagitis.                           - Z-line regular, 43 cm from the incisors.                           - Gastritis. Biopsied.                           - Normal incisura, antrum and pylorus.                           -  Normal examined duodenum. Recommendation:           - Patient has a contact number available for                            emergencies. The signs and symptoms of potential                            delayed complications  were discussed with the                            patient. Return to normal activities tomorrow.                            Written discharge instructions were provided to the                            patient.                           - Resume previous diet.                           - Continue present medications.                           - Await pathology results.                           - Return to GI clinic as previously scheduled. Sandor Flatter, MD 05/25/2024 8:49:45 AM

## 2024-05-25 NOTE — Progress Notes (Signed)
 Report to PACU, RN, vss, BBS= Clear.

## 2024-05-25 NOTE — Patient Instructions (Signed)

## 2024-05-26 ENCOUNTER — Telehealth: Payer: Self-pay

## 2024-05-26 NOTE — Telephone Encounter (Signed)
 Left message

## 2024-05-28 ENCOUNTER — Encounter: Payer: Self-pay | Admitting: Family Medicine

## 2024-05-28 ENCOUNTER — Telehealth (INDEPENDENT_AMBULATORY_CARE_PROVIDER_SITE_OTHER): Admitting: Family Medicine

## 2024-05-28 VITALS — Wt 199.4 lb

## 2024-05-28 DIAGNOSIS — F418 Other specified anxiety disorders: Secondary | ICD-10-CM

## 2024-05-28 DIAGNOSIS — F988 Other specified behavioral and emotional disorders with onset usually occurring in childhood and adolescence: Secondary | ICD-10-CM

## 2024-05-28 LAB — SURGICAL PATHOLOGY

## 2024-05-28 MED ORDER — AMPHETAMINE-DEXTROAMPHET ER 20 MG PO CP24: 20.0000 mg | ORAL_CAPSULE | ORAL | 0 refills | Status: DC

## 2024-05-28 MED ORDER — LORAZEPAM 0.5 MG PO TABS: 0.5000 mg | ORAL_TABLET | Freq: Four times a day (QID) | ORAL | 0 refills | Status: DC | PRN

## 2024-05-28 NOTE — Progress Notes (Signed)
 Patient ID: Bob [name], male   DOB: [birth year], 28 y.o.   MRN: [id number]   Virtual Visit via Video Note  I connected with Bob [name] on 05/28/24 at  8:45 AM EDT by a video enabled telemedicine application and verified that I am speaking with the correct person using two identifiers.  Location patient: home Location provider:work or home office Persons participating in the virtual visit: patient, provider  I discussed the limitations of evaluation and management by telemedicine and the availability of in person appointments. The patient expressed understanding and agreed to proceed.   HPI: Bob [name] set up follow-up for medication check and refills.  Takes Adderall extended release 20 mg once daily and this seems be working well for him.  He does not take this daily.  Denies any side effects.  Also has history of situational anxiety and upcoming trip to [place] in October and is requesting refill of lorazepam  both for flight and for insomnia issues after arriving.  Has had some recent GI issues and has been followed by GI and had endoscopy which showed apparently some eosinophilic esophagitis changes.   ROS: See pertinent positives and negatives per HPI.  Past Medical History:  Diagnosis Date   ADD (attention deficit disorder)    Broken arm 2002   right   Depression     Past Surgical History:  Procedure Laterality Date   broken arm Right    pins at age 51   LAPAROSCOPIC APPENDECTOMY N/A 11/29/2020   Procedure: APPENDECTOMY LAPAROSCOPIC;  Surgeon: Ebbie Cough, MD;  Location: [facility] OR;  Service: General;  Laterality: N/A;    Family History  Problem Relation Age of Onset   Cancer Paternal Aunt        colon   Heart disease Maternal Grandmother    Hypertension Maternal Grandfather    Hyperlipidemia Maternal Grandfather    Diabetes Maternal Grandfather    Alcohol abuse Paternal Grandmother    Cancer Paternal Grandmother        lung   Esophageal cancer  Neg Hx    Liver disease Neg Hx    Colon cancer Neg Hx    Rectal cancer Neg Hx    Stomach cancer Neg Hx     SOCIAL HX: non-smoker.    Current Outpatient Medications:    amphetamine -dextroamphetamine  (ADDERALL XR) 20 MG 24 hr capsule, Take 1 capsule (20 mg total) by mouth every morning., Disp: 30 capsule, Rfl: 0   LORazepam  (ATIVAN ) 0.5 MG tablet, Take 1 tablet (0.5 mg total) by mouth every 6 (six) hours as needed for anxiety., Disp: 30 tablet, Rfl: 0   pantoprazole  (PROTONIX ) 40 MG tablet, TAKE 1 TABLET BY MOUTH TWICE A DAY, Disp: 180 tablet, Rfl: 0  Current Facility-Administered Medications:    ondansetron  (ZOFRAN ) 4 mg in sodium chloride  0.9 % 50 mL IVPB, 4 mg, Intravenous, Once, Cirigliano, Vito V, DO  EXAM:  VITALS per patient if applicable:  GENERAL: alert, oriented, appears well and in no acute distress  HEENT: atraumatic, conjunttiva clear, no obvious abnormalities on inspection of external nose and ears  NECK: normal movements of the head and neck  LUNGS: on inspection no signs of respiratory distress, breathing rate appears normal, no obvious gross SOB, gasping or wheezing  CV: no obvious cyanosis  MS: moves all visible extremities without noticeable abnormality  PSYCH/NEURO: pleasant and cooperative, no obvious depression or anxiety, speech and thought processing grossly intact  ASSESSMENT AND PLAN:  Discussed the following assessment and plan:  #1 ADD.  Stable on Adderall XR 20 mg once daily.  Continue current regimen.  Refill provided  #2 situational anxiety.  Patient requesting lorazepam  which he has used for flying previously.  Prescription sent     I discussed the assessment and treatment plan with the patient. The patient was provided an opportunity to ask questions and all were answered. The patient agreed with the plan and demonstrated an understanding of the instructions.   The patient was advised to call back or seek an in-person evaluation if the  symptoms worsen or if the condition fails to improve as anticipated.     Wolm Scarlet, MD

## 2024-05-31 ENCOUNTER — Ambulatory Visit: Payer: Self-pay | Admitting: Gastroenterology

## 2024-05-31 DIAGNOSIS — B9681 Helicobacter pylori [H. pylori] as the cause of diseases classified elsewhere: Secondary | ICD-10-CM

## 2024-06-03 MED ORDER — METRONIDAZOLE 250 MG PO TABS: 250.0000 mg | ORAL_TABLET | Freq: Four times a day (QID) | ORAL | 0 refills | Status: AC

## 2024-06-03 MED ORDER — DOXYCYCLINE HYCLATE 100 MG PO TABS: 100.0000 mg | ORAL_TABLET | Freq: Two times a day (BID) | ORAL | 0 refills | Status: AC

## 2024-06-03 MED ORDER — BISMUTH SUBSALICYLATE 262 MG PO CHEW: 524.0000 mg | CHEWABLE_TABLET | Freq: Four times a day (QID) | ORAL | 0 refills | Status: AC

## 2024-06-16 ENCOUNTER — Ambulatory Visit (INDEPENDENT_AMBULATORY_CARE_PROVIDER_SITE_OTHER): Admitting: Gastroenterology

## 2024-06-16 ENCOUNTER — Encounter: Payer: Self-pay | Admitting: Gastroenterology

## 2024-06-16 VITALS — BP 110/72 | HR 69 | Ht 69.0 in | Wt 201.8 lb

## 2024-06-16 DIAGNOSIS — B9681 Helicobacter pylori [H. pylori] as the cause of diseases classified elsewhere: Secondary | ICD-10-CM | POA: Diagnosis not present

## 2024-06-16 DIAGNOSIS — K2970 Gastritis, unspecified, without bleeding: Secondary | ICD-10-CM

## 2024-06-16 DIAGNOSIS — K200 Eosinophilic esophagitis: Secondary | ICD-10-CM

## 2024-06-16 NOTE — Progress Notes (Signed)
 Chief Complaint:    Eosinophilic Esophagitis  GI History: 28 year old male with Eosinophilic Esophagitis.  Initially presented to the GI clinic in 02/2024 with progressive dysphagia.  Worse with bread, rice.  History of 2 food impactions in 03/2020 and 05/2023, the latter requiring ER evaluation.  Does have postprandial heartburn, particularly with drinking alcohol and energy drinks.  His family history includes a father with similar swallowing difficulties, possibly related to Barrett's esophagus, though the exact diagnosis is unknown   Endoscopic History: - 03/16/2024: EGD: ringed esophagus with linear furrows ( path: Up to 50 eosinophils per hpf in distal and proximal esophagus) with an otherwise normal stomach and duodenum. Esophagus dilated with 17 and 18 mm Savary dilator with appropriate mucosal disruption x 2. Was diagnosed with EOE and treated with high- dose Protonix  40 mg twice daily x 2 months - 05/25/2024: EGD: Feeling incision and longitudinal furrows without stricture dilated with 18 mm Savary.  Biopsies from the distal (up to 20 eosinophils) and proximal esophagus ( <1 eosinophil per hpf).  Normal Z-line at 43 cm, mild H. pylori gastritis, treated with quadruple therapy  HPI:     Patient is a 28 y.o. male presenting to the Gastroenterology Clinic for routine follow-up.   Has been doing well since EGD with dilation x 2 and starting high-dose PPI.  Dysphagia has improved.  Still eating cautiously though.  Started H pylori tx on 8/11. Tolerating quadruple therapy regimen w/o issue.     Review of systems:     No chest pain, no SOB, no fevers, no urinary sx   Past Medical History:  Diagnosis Date   ADD (attention deficit disorder)    Broken arm 2002   right   Depression     Patient's surgical history, family medical history, social history, medications and allergies were all reviewed in Epic    Current Outpatient Medications  Medication Sig Dispense Refill    amphetamine -dextroamphetamine  (ADDERALL XR) 20 MG 24 hr capsule Take 1 capsule (20 mg total) by mouth every morning. 30 capsule 0   bismuth  subsalicylate (PEPTO-BISMOL) 262 MG chewable tablet Chew 2 tablets (524 mg total) by mouth 4 (four) times daily for 14 days. 112 tablet 0   doxycycline  (VIBRA -TABS) 100 MG tablet Take 1 tablet (100 mg total) by mouth 2 (two) times daily for 14 days. 28 tablet 0   LORazepam  (ATIVAN ) 0.5 MG tablet Take 1 tablet (0.5 mg total) by mouth every 6 (six) hours as needed for anxiety. 30 tablet 0   metroNIDAZOLE  (FLAGYL ) 250 MG tablet Take 1 tablet (250 mg total) by mouth 4 (four) times daily for 14 days. 56 tablet 0   pantoprazole  (PROTONIX ) 40 MG tablet TAKE 1 TABLET BY MOUTH TWICE A DAY 180 tablet 0   Current Facility-Administered Medications  Medication Dose Route Frequency Provider Last Rate Last Admin   ondansetron  (ZOFRAN ) 4 mg in sodium chloride  0.9 % 50 mL IVPB  4 mg Intravenous Once Beryle Zeitz V, DO        Physical Exam:     BP 110/72   Pulse 69   Ht 5' 9 (1.753 m)   Wt 201 lb 12.8 oz (91.5 kg)   BMI 29.80 kg/m   GENERAL:  Pleasant male in NAD PSYCH: : Cooperative, normal affect CARDIAC:  RRR, no murmur heard, no peripheral edema PULM: Normal respiratory effort, lungs CTA bilaterally, no wheezing ABDOMEN:  Nondistended, soft, nontender. No obvious masses, no hepatomegaly,  normal bowel sounds SKIN:  turgor, no  lesions seen Musculoskeletal:  Normal muscle tone, normal strength NEURO: Alert and oriented x 3, no focal neurologic deficits   IMPRESSION and PLAN:    1) Eosinophilic Esophagitis Discussed pathophysiology along with relationship to eczema and childhood asthma today.  Good clinical, endoscopic, and histologic response to high-dose PPI. - Continue pantoprazole  40 mg twice daily until completion of H. pylori course as below, then will plan to reduce to 40 mg daily - I asked him to message me a couple weeks after reducing  pantoprazole  to let me know if there is any breakthrough symptoms after dosing change.  If return of symptoms, can go back to twice daily dosing or consideration of topical steroids  2) H. pylori gastritis - Complete quadruple therapy as prescribed - Ordered H. pylori Diatherix panel to confirm eradication after completion of quadruple therapy  RTC in 6 months or sooner as needed      Sandor LULLA Flatter ,DO, FACG 06/16/2024, 9:01 AM

## 2024-06-16 NOTE — Patient Instructions (Signed)
 Your provider has ordered Diathe[employer] stool testing for you. You have received a kit from our office today containing all necessary supplies to complete this test. Please carefully read the stool collection instructions provided in the kit before opening the accompanying materials. In addition, be sure there is a label providing your full name and date of birth on the puritan opti-swab tube that is supplied in the kit (if you do not see a label with this information on your test tube, please make us  aware before test collection!). After completing the test, you should secure the purtian tube into the specimen biohazard bag. [facility] E-Req sheet (including date and time of specimen collection) should be placed into the outside pocket of the specimen biohazard bag and returned to the [facility] lab (basement floor of [facility]) within 3 days of collection. Please make sure to give the specimen to a staff member at the lab. DO NOT leave the specimen on the counter.   If the specimen date and time (can be found in the upper right boxed portion of the sheet) are not filled out on the E-Req sheet, the test will NOT be performed.    Follow up in 6 months.  Thank you for entrusting me with your care and for choosing [facility], Dr. Sandor Flatter  _______________________________________________________  If your blood pressure at your visit was 140/90 or greater, please contact your primary care physician to follow up on this.  _______________________________________________________  If you are age 39 or older, your body mass index should be between 23-30. Your Body mass index is 29.8 kg/m. If this is out of the aforementioned range listed, please consider follow up with your Primary Care Provider.  If you are age 18 or younger, your body mass index should be between 19-25. Your Body mass index is 29.8 kg/m. If this is out of the aformentioned range listed,  please consider follow up with your Primary Care Provider.   ________________________________________________________  The [facility] providers would like to encourage you to use MYCHART to communicate with providers for non-urgent requests or questions.  Due to long hold times on the telephone, sending your provider a message by [facility] may be a faster and more efficient way to get a response.  Please allow 48 business hours for a response.  Please remember that this is for non-urgent requests.  _______________________________________________________  [facility] is using a team-based approach to care.  Your team is made up of your doctor and two to three APPS. Our APPS (Nurse Practitioners and Physician Assistants) work with your physician to ensure care continuity for you. They are fully qualified to address your health concerns and develop a treatment plan. They communicate directly with your gastroenterologist to care for you. Seeing the Advanced Practice Practitioners on your physician's team can help you by facilitating care more promptly, often allowing for earlier appointments, access to diagnostic testing, procedures, and other specialty referrals.

## 2024-07-04 ENCOUNTER — Other Ambulatory Visit: Payer: Self-pay | Admitting: Gastroenterology

## 2024-07-04 DIAGNOSIS — R1319 Other dysphagia: Secondary | ICD-10-CM

## 2024-07-06 ENCOUNTER — Other Ambulatory Visit: Payer: Self-pay | Admitting: Family Medicine

## 2024-07-07 ENCOUNTER — Encounter: Payer: Self-pay | Admitting: Family Medicine

## 2024-07-08 MED ORDER — AMPHETAMINE-DEXTROAMPHET ER 20 MG PO CP24: 20.0000 mg | ORAL_CAPSULE | ORAL | 0 refills | Status: DC

## 2024-07-08 MED ORDER — LORAZEPAM 0.5 MG PO TABS: 0.5000 mg | ORAL_TABLET | Freq: Four times a day (QID) | ORAL | 0 refills | Status: DC | PRN

## 2024-07-08 NOTE — Telephone Encounter (Signed)
 Refills sent.  Do not recommend regular use of Lorazepam  to avoid dependency.  For travel, may be useful to take 4-5 nights while adapting to time change but would then try to leave off.  Wolm LELON Scarlet MD [facility] at [facility]

## 2024-10-05 ENCOUNTER — Encounter: Payer: Self-pay | Admitting: Family Medicine

## 2024-10-05 MED ORDER — AMPHETAMINE-DEXTROAMPHET ER 20 MG PO CP24: 20.0000 mg | ORAL_CAPSULE | ORAL | 0 refills | Status: AC

## 2024-10-05 MED ORDER — LORAZEPAM 0.5 MG PO TABS: 0.5000 mg | ORAL_TABLET | Freq: Four times a day (QID) | ORAL | 0 refills | Status: AC | PRN
